# Patient Record
Sex: Female | Born: 1975 | Race: White | Hispanic: Yes | Marital: Single | Smoking: Never smoker
Health system: Southern US, Community
[De-identification: ages and names within clinical notes are randomized; demographics above are authoritative.]

## PROBLEM LIST (undated history)

## (undated) DIAGNOSIS — Z789 Other specified health status: Secondary | ICD-10-CM

## (undated) DIAGNOSIS — O139 Gestational [pregnancy-induced] hypertension without significant proteinuria, unspecified trimester: Secondary | ICD-10-CM

## (undated) HISTORY — DX: Gestational (pregnancy-induced) hypertension without significant proteinuria, unspecified trimester: O13.9

---

## 1999-06-20 ENCOUNTER — Other Ambulatory Visit: Admission: RE | Admit: 1999-06-20 | Discharge: 1999-06-20 | Payer: Self-pay | Admitting: Obstetrics

## 1999-08-14 ENCOUNTER — Inpatient Hospital Stay (HOSPITAL_COMMUNITY): Admission: AD | Admit: 1999-08-14 | Discharge: 1999-08-19 | Payer: Self-pay | Admitting: Obstetrics

## 1999-08-14 ENCOUNTER — Encounter: Payer: Self-pay | Admitting: *Deleted

## 1999-08-14 ENCOUNTER — Encounter (INDEPENDENT_AMBULATORY_CARE_PROVIDER_SITE_OTHER): Payer: Self-pay

## 1999-08-21 ENCOUNTER — Inpatient Hospital Stay (HOSPITAL_COMMUNITY): Admission: EM | Admit: 1999-08-21 | Discharge: 1999-08-21 | Payer: Self-pay | Admitting: *Deleted

## 1999-11-05 ENCOUNTER — Emergency Department (HOSPITAL_COMMUNITY): Admission: EM | Admit: 1999-11-05 | Discharge: 1999-11-05 | Payer: Self-pay | Admitting: Emergency Medicine

## 1999-11-07 ENCOUNTER — Emergency Department (HOSPITAL_COMMUNITY): Admission: EM | Admit: 1999-11-07 | Discharge: 1999-11-07 | Payer: Self-pay | Admitting: Emergency Medicine

## 2006-03-12 ENCOUNTER — Ambulatory Visit (HOSPITAL_COMMUNITY): Admission: RE | Admit: 2006-03-12 | Discharge: 2006-03-12 | Payer: Self-pay | Admitting: Family Medicine

## 2006-03-17 ENCOUNTER — Ambulatory Visit: Payer: Self-pay | Admitting: Obstetrics & Gynecology

## 2006-03-31 ENCOUNTER — Ambulatory Visit: Payer: Self-pay | Admitting: Obstetrics & Gynecology

## 2006-04-01 ENCOUNTER — Ambulatory Visit: Payer: Self-pay | Admitting: Obstetrics & Gynecology

## 2006-04-17 ENCOUNTER — Ambulatory Visit: Payer: Self-pay | Admitting: Obstetrics & Gynecology

## 2006-05-08 ENCOUNTER — Ambulatory Visit: Payer: Self-pay | Admitting: Family Medicine

## 2006-05-12 ENCOUNTER — Ambulatory Visit (HOSPITAL_COMMUNITY): Admission: RE | Admit: 2006-05-12 | Discharge: 2006-05-12 | Payer: Self-pay | Admitting: Family Medicine

## 2006-05-22 ENCOUNTER — Ambulatory Visit: Payer: Self-pay | Admitting: Obstetrics & Gynecology

## 2006-05-29 ENCOUNTER — Ambulatory Visit: Payer: Self-pay | Admitting: Family Medicine

## 2006-06-05 ENCOUNTER — Ambulatory Visit: Payer: Self-pay | Admitting: *Deleted

## 2006-06-12 ENCOUNTER — Ambulatory Visit: Payer: Self-pay | Admitting: Family Medicine

## 2006-06-19 ENCOUNTER — Ambulatory Visit: Payer: Self-pay | Admitting: Family Medicine

## 2006-06-26 ENCOUNTER — Ambulatory Visit: Payer: Self-pay | Admitting: Obstetrics & Gynecology

## 2006-07-03 ENCOUNTER — Ambulatory Visit: Payer: Self-pay | Admitting: Obstetrics & Gynecology

## 2006-07-07 ENCOUNTER — Ambulatory Visit: Payer: Self-pay | Admitting: Family Medicine

## 2006-07-10 ENCOUNTER — Ambulatory Visit: Payer: Self-pay | Admitting: Family Medicine

## 2006-07-10 ENCOUNTER — Ambulatory Visit (HOSPITAL_COMMUNITY): Admission: RE | Admit: 2006-07-10 | Discharge: 2006-07-10 | Payer: Self-pay | Admitting: Obstetrics and Gynecology

## 2006-07-14 ENCOUNTER — Ambulatory Visit: Payer: Self-pay | Admitting: Obstetrics & Gynecology

## 2006-07-15 ENCOUNTER — Inpatient Hospital Stay (HOSPITAL_COMMUNITY): Admission: AD | Admit: 2006-07-15 | Discharge: 2006-07-18 | Payer: Self-pay | Admitting: Family Medicine

## 2006-07-15 ENCOUNTER — Ambulatory Visit: Payer: Self-pay | Admitting: Physician Assistant

## 2010-06-01 NOTE — Discharge Summary (Signed)
[facility]  Patient:    Anna Higgins, Anna Higgins                    MRN: [id number] Adm. Date:  [id number] Disc. Date: [id number] Attending:  Michaelle Copas Dictator:   Cheree Ditto, M.D. CC:         [facility]   Discharge Summary  DATE OF BIRTH:                02-08-1975  DISCHARGE DIAGNOSES:          1. Severe preeclampsia, improved.                               2. Term pregnancy, delivered.  PROCEDURES:                   1. Ultrasound August 14, 1999.                               2. Low transverse C-section August 15, 1999.  DISCHARGE MEDICATIONS:        1. Procardia XL 60 mg 1 p.o. q.d.                               2. ______ prenatal vitamins 1 p.o. q.d. while                                  breast feeding.                               3. Ibuprofen 600 mg 1 p.o. q.8h. p.r.n. pain.                               4. Depo-Provera given prior to discharge.  DISPOSITION AND FOLLOW-UP:     1. Maternity admissions follow-up for a blood                                  pressure check and evaluation in two days                                  after discharge.                               2[facility]  ADMITTING HISTORY/PHYSICAL:   [age] year-old g 1, p 0, at 36 and three weeks by ultrasound at 28 weeks but 40 weeks by serial L P, presents after being sent over from Dr. Tawny Hopping office with increased blood pressure, edema in her hands, face, and legs, as well as proteinuria.  PAST OB HISTORY:              None.  SOCIAL HISTORY:               No smoking, alcohol, or other drugs.  PAST MEDICAL HISTORY:         No  chronic medical problems.  PAST SURGICAL HISTORY:        None.  MEDICATIONS:                  Prenatal vitamins.  ALLERGIES:                    No known drug allergies.  PRENATAL LABORATORIES:        VDRL negative, hepatitis B surface antigen negative, hemoglobin 11.3, hematocrit  33.9, Rubella immune, one hour glucose 118.  PHYSICAL EXAMINATION:  VITAL SIGNS:                  Temperature 97.7, blood pressure 170s-200s/90s-115, heart rate 50s to 60s, fetal heart tones 130s with some acceleration and then mild variable decelerations.  HEENT:                        Pupils, equal, round, reactive to light and accommodation.  Extraocular movements intact.  Oropharynx is clear.  NECK:                         Supple without lymphadenopathy.  HEART:                        Regular rate and rhythm.  LUNGS:                        Clear to auscultation bilaterally.  ABDOMEN:                      Soft and gravid.  CERVICAL:                     Fingertip, 50% and -3.  Hernia is negative.  LABORATORIES:                 Sodium 134, potassium 4.2, chloride 109, CO2 20, BUN 16, creatinine .9, white count 4.6, hemoglobin 3.2, hematocrit 39.1, platelets 193, uric acid 5.9, LDH 220, SGOT 29, SGPT 21.Marland Kitchen  HOSPITAL COURSE:              Ms. Anna Higgins is a 35 year old g 1, at 63 and 3 by a 28 week ultrasound but approximately 40 weeks by a sure LMP.  She presented with severe preeclampsia.  She was admitted for evaluation.  Ultrasound was obtained on August 14, 1999, which showed a single living intrauterine fetus in cephalic presentation.  There was evidence for asymmetric intrauterine growth restriction.  The baby had a normal biophysical profile.  She was not having any headaches or neurological symptoms and had been feeling baby move well over the past couple of days.  Induction was attempted with Cervidil.  Fetal heart rate decelerations prompted an urgent low transverse C-section.  Apgars were 8 at one minute and 8 at five minutes.  The patient was transferred to the adult intensive care unit after her C-section.  She was given postpartum magnesium.  After the magnesium was discontinued, she continued to have elevated blood pressures and was given Procardia.  This was increased  prior to her discharge to Procardia XL 60 mg p.o. q.d.  A two-week supply of these medicines was provided through the indigent fund of the hospital.  She was discharged on Procardia.  Blood pressures on the day of discharge were approximately 130s-140/90s.  Arrangements were made for a follow-up in two  days at maternity admission.  At that time, the decision can be made as to whether or not to continue Procardia.  If the Procardia needs to be continued beyond the two week period, she will need either another prescription or more help with her medicines.  She was feeling well and left edematous at the time of discharge.  DISCHARGE LABORATORIES:       Hemoglobin 11.3, hematocrit 33.3 on August 16, 1999.DD:  08/19/99 TD:  08/20/99 Job: [id number]

## 2010-06-01 NOTE — Op Note (Signed)
[facility]  Patient:    Anna Higgins, Anna Higgins                    MRN: [id number] Proc. Date: 08/15/99 Adm. Date:  [id number] Attending:  Michaelle Copas                           Operative Report  PREOPERATIVE DIAGNOSES:       Intrauterine pregnancy at approximately 37 to [redacted] weeks gestation with severe intrauterine growth retardation and severe preeclampsia, and repetitive late fetal heart decelerations.  POSTOPERATIVE DIAGNOSES:      Intrauterine pregnancy at approximately 37 to [redacted] weeks gestation with severe intrauterine growth retardation and severe preeclampsia, and repetitive late fetal heart decelerations.  OPERATION:                    Low transverse cesarean.  OPERATOR:                     Conni Elliot, M.D.                               Garlan Fillers. Marina Goodell, M.D.  ANESTHESIA:                   Spinal.  OPERATIVE FINDINGS:           4 pounds 6 ounce female with Apgars of 8 and 8. Cord pH 7.17.  The cord appeared to be hyperspiraled and short and was sent to pathology in its entirety.  DESCRIPTION OF PROCEDURE:     After adequate level of spinal anesthetic, the patient placed supine in the left-tilt position receiving oxygen and was prepped and draped in a sterile fashion.  A low transverse Pfannenstiel incision was made to the skin, subcutaneous tissue, and fascia.  Rectus muscles divided in the midline.  Perineal cavity entered, bladder flap created, low transverse uterine incision was made.  Upon entering the uterine cavity, it was found to have meconium-stained fluid, and babys head was delivered and received suction prior to delivery of the chest.  The remainder of the body was delivered.  The cord was clamped and cut and passed to the neonatologist in attendance.  ______ uterus, bladder flap, perineum ______ fashion.  Estimated blood loss approximately 750 cc.  ______ . DD:  08/15/99 TD:  08/15/99 Job: [id number]

## 2010-10-30 LAB — CBC
HCT: 30.3 — ABNORMAL LOW
HCT: 36.3
Hemoglobin: 12.2
MCHC: 33.4
MCHC: 33.5
MCV: 87
MCV: 87.4
Platelets: 196
Platelets: 236
RBC: 3.46 — ABNORMAL LOW
RBC: 4.18
RDW: 14
WBC: 6.6

## 2010-10-31 LAB — POCT URINALYSIS DIP (DEVICE)
Hgb urine dipstick: NEGATIVE
Ketones, ur: NEGATIVE
Operator id: 120861
Operator id: 148111
Operator id: 159681
Protein, ur: 30 — AB
Protein, ur: NEGATIVE
Protein, ur: NEGATIVE
Specific Gravity, Urine: 1.01
Specific Gravity, Urine: 1.01
Specific Gravity, Urine: 1.025
Urobilinogen, UA: 0.2
Urobilinogen, UA: 0.2
Urobilinogen, UA: 0.2
pH: 6.5
pH: 6.5
pH: 7

## 2010-11-01 LAB — POCT URINALYSIS DIP (DEVICE)
Hgb urine dipstick: NEGATIVE
Hgb urine dipstick: NEGATIVE
Ketones, ur: NEGATIVE
Ketones, ur: NEGATIVE
Operator id: 134861
Protein, ur: NEGATIVE
Protein, ur: NEGATIVE
Specific Gravity, Urine: 1.01
Specific Gravity, Urine: 1.015
Urobilinogen, UA: 0.2
Urobilinogen, UA: 0.2
pH: 6
pH: 7

## 2010-11-29 LAB — CYTOLOGY - PAP: Pap: NEGATIVE

## 2011-01-15 NOTE — L&D Delivery Note (Signed)
Delivery Note At 2:42 PM a viable female was delivered via Vaginal, Spontaneous Delivery (Presentation: Left Occiput Anterior).  APGAR: , ; weight .   Placenta status: , .  Cord: 3 vessels with the following complications: .  Cord pH: not done  Anesthesia: Local  Episiotomy: None Lacerations:  Suture Repair: 2.0 Est. Blood Loss (mL):   Mom to postpartum.  Baby to nursery-stable.  Atley Scarboro A 12/29/2011, 2:53 PM

## 2011-07-23 LAB — OB RESULTS CONSOLE GC/CHLAMYDIA: Chlamydia: NEGATIVE

## 2011-07-23 LAB — OB RESULTS CONSOLE HIV ANTIBODY (ROUTINE TESTING): HIV: NONREACTIVE

## 2011-07-23 LAB — OB RESULTS CONSOLE ABO/RH: RH Type: POSITIVE

## 2011-07-23 LAB — OB RESULTS CONSOLE RPR: RPR: NONREACTIVE

## 2011-08-24 ENCOUNTER — Inpatient Hospital Stay (HOSPITAL_COMMUNITY): Admission: AD | Admit: 2011-08-24 | Payer: Self-pay | Source: Ambulatory Visit | Admitting: Obstetrics

## 2011-12-03 LAB — OB RESULTS CONSOLE GBS: GBS: NEGATIVE

## 2011-12-18 ENCOUNTER — Other Ambulatory Visit: Payer: Self-pay | Admitting: Obstetrics

## 2011-12-28 ENCOUNTER — Encounter (HOSPITAL_COMMUNITY): Payer: Self-pay | Admitting: *Deleted

## 2011-12-28 ENCOUNTER — Inpatient Hospital Stay (HOSPITAL_COMMUNITY)
Admission: AD | Admit: 2011-12-28 | Discharge: 2011-12-28 | Disposition: A | Discharge status: Home / Self Care | Payer: Self-pay | Source: Ambulatory Visit | Attending: Obstetrics | Admitting: Obstetrics

## 2011-12-28 DIAGNOSIS — R109 Unspecified abdominal pain: Secondary | ICD-10-CM | POA: Insufficient documentation

## 2011-12-28 DIAGNOSIS — M549 Dorsalgia, unspecified: Secondary | ICD-10-CM | POA: Insufficient documentation

## 2011-12-28 DIAGNOSIS — O99891 Other specified diseases and conditions complicating pregnancy: Secondary | ICD-10-CM | POA: Insufficient documentation

## 2011-12-28 HISTORY — DX: Other specified health status: Z78.9

## 2011-12-28 NOTE — Progress Notes (Signed)
Dr Gaynell Face notified of patient arrival. ?? c-section per patient. Cervical exam, 2.5, 80-3. Plan to monitor the patient times 1 hour and recheck her cervix.

## 2011-12-28 NOTE — Progress Notes (Signed)
Dr. Gaynell Face notified of cervical exam change 3, 80, -2. With blood show. Orders received to monitor patient another 1-2 hours and re-check.

## 2011-12-28 NOTE — MAU Note (Signed)
Pt c/o back pain and lower abd cramping on and off all day. Reports some vaginal bleeding and good fetal movment.

## 2011-12-28 NOTE — Progress Notes (Signed)
Dr. Gaynell Face notified of cervical exam. Orders received to send patient home.

## 2011-12-29 ENCOUNTER — Inpatient Hospital Stay (HOSPITAL_COMMUNITY)
Admission: AD | Admit: 2011-12-29 | Discharge: 2011-12-31 | DRG: 775 | Disposition: A | Discharge status: Home / Self Care | Payer: Medicaid Other | Source: Ambulatory Visit | Attending: Obstetrics | Admitting: Obstetrics

## 2011-12-29 ENCOUNTER — Encounter (HOSPITAL_COMMUNITY): Payer: Self-pay | Admitting: Obstetrics and Gynecology

## 2011-12-29 DIAGNOSIS — O09529 Supervision of elderly multigravida, unspecified trimester: Principal | ICD-10-CM | POA: Diagnosis present

## 2011-12-29 LAB — CBC
HCT: 40.8 % (ref 36.0–46.0)
Hemoglobin: 13.9 g/dL (ref 12.0–15.0)
MCH: 30.8 pg (ref 26.0–34.0)
MCHC: 34.1 g/dL (ref 30.0–36.0)
RDW: 13.8 % (ref 11.5–15.5)

## 2011-12-29 MED ORDER — IBUPROFEN 600 MG PO TABS
600.0000 mg | ORAL_TABLET | Freq: Four times a day (QID) | ORAL | Status: DC
  Administered 2011-12-29 – 2011-12-31 (×6): 600 mg via ORAL
  Filled 2011-12-29 (×2): qty 1

## 2011-12-29 MED ORDER — LIDOCAINE HCL (PF) 1 % IJ SOLN: 30.0000 mL | INTRAMUSCULAR | Status: DC | PRN

## 2011-12-29 MED ORDER — ONDANSETRON HCL 4 MG PO TABS: 4.0000 mg | ORAL_TABLET | ORAL | Status: DC | PRN

## 2011-12-29 MED ORDER — DIPHENHYDRAMINE HCL 50 MG/ML IJ SOLN: 12.5000 mg | INTRAMUSCULAR | Status: DC | PRN

## 2011-12-29 MED ORDER — OXYTOCIN BOLUS FROM INFUSION: 500.0000 mL | INTRAVENOUS | Status: DC

## 2011-12-29 MED ORDER — TETANUS-DIPHTH-ACELL PERTUSSIS 5-2.5-18.5 LF-MCG/0.5 IM SUSP
0.5000 mL | Freq: Once | INTRAMUSCULAR | Status: AC
  Administered 2011-12-29: 0.5 mL via INTRAMUSCULAR
  Filled 2011-12-29: qty 0.5

## 2011-12-29 MED ORDER — OXYTOCIN 40 UNITS IN LACTATED RINGERS INFUSION - SIMPLE MED
INTRAVENOUS | Status: AC
  Filled 2011-12-29: qty 1000

## 2011-12-29 MED ORDER — ONDANSETRON HCL 4 MG/2ML IJ SOLN: 4.0000 mg | INTRAMUSCULAR | Status: DC | PRN

## 2011-12-29 MED ORDER — DIBUCAINE 1 % RE OINT: 1.0000 "application " | TOPICAL_OINTMENT | RECTAL | Status: DC | PRN

## 2011-12-29 MED ORDER — LANOLIN HYDROUS EX OINT: TOPICAL_OINTMENT | CUTANEOUS | Status: DC | PRN

## 2011-12-29 MED ORDER — IBUPROFEN 600 MG PO TABS
600.0000 mg | ORAL_TABLET | Freq: Four times a day (QID) | ORAL | Status: DC | PRN
  Administered 2011-12-29: 600 mg via ORAL
  Filled 2011-12-29 (×5): qty 1

## 2011-12-29 MED ORDER — EPHEDRINE 5 MG/ML INJ: 10.0000 mg | INTRAVENOUS | Status: DC | PRN

## 2011-12-29 MED ORDER — ZOLPIDEM TARTRATE 5 MG PO TABS: 5.0000 mg | ORAL_TABLET | Freq: Every evening | ORAL | Status: DC | PRN

## 2011-12-29 MED ORDER — FLEET ENEMA 7-19 GM/118ML RE ENEM: 1.0000 | ENEMA | RECTAL | Status: DC | PRN

## 2011-12-29 MED ORDER — OXYCODONE-ACETAMINOPHEN 5-325 MG PO TABS
1.0000 | ORAL_TABLET | ORAL | Status: DC | PRN
  Administered 2011-12-29 – 2011-12-30 (×3): 1 via ORAL
  Filled 2011-12-29 (×3): qty 1

## 2011-12-29 MED ORDER — PHENYLEPHRINE 40 MCG/ML (10ML) SYRINGE FOR IV PUSH (FOR BLOOD PRESSURE SUPPORT): 80.0000 ug | PREFILLED_SYRINGE | INTRAVENOUS | Status: DC | PRN

## 2011-12-29 MED ORDER — CITRIC ACID-SODIUM CITRATE 334-500 MG/5ML PO SOLN: 30.0000 mL | ORAL | Status: DC | PRN

## 2011-12-29 MED ORDER — SIMETHICONE 80 MG PO CHEW: 80.0000 mg | CHEWABLE_TABLET | ORAL | Status: DC | PRN

## 2011-12-29 MED ORDER — ACETAMINOPHEN 325 MG PO TABS: 650.0000 mg | ORAL_TABLET | ORAL | Status: DC | PRN

## 2011-12-29 MED ORDER — LIDOCAINE HCL (PF) 1 % IJ SOLN
INTRAMUSCULAR | Status: AC
  Filled 2011-12-29: qty 30

## 2011-12-29 MED ORDER — BENZOCAINE-MENTHOL 20-0.5 % EX AERO: 1.0000 "application " | INHALATION_SPRAY | CUTANEOUS | Status: DC | PRN

## 2011-12-29 MED ORDER — OXYTOCIN 40 UNITS IN LACTATED RINGERS INFUSION - SIMPLE MED: 62.5000 mL/h | INTRAVENOUS | Status: DC

## 2011-12-29 MED ORDER — WITCH HAZEL-GLYCERIN EX PADS: 1.0000 "application " | MEDICATED_PAD | CUTANEOUS | Status: DC | PRN

## 2011-12-29 MED ORDER — FERROUS SULFATE 325 (65 FE) MG PO TABS
325.0000 mg | ORAL_TABLET | Freq: Two times a day (BID) | ORAL | Status: DC
  Administered 2011-12-30 – 2011-12-31 (×2): 325 mg via ORAL
  Filled 2011-12-29 (×2): qty 1

## 2011-12-29 MED ORDER — FENTANYL 2.5 MCG/ML BUPIVACAINE 1/10 % EPIDURAL INFUSION (WH - ANES): 14.0000 mL/h | INTRAMUSCULAR | Status: DC

## 2011-12-29 MED ORDER — LACTATED RINGERS IV SOLN: INTRAVENOUS | Status: DC

## 2011-12-29 MED ORDER — DIPHENHYDRAMINE HCL 25 MG PO CAPS: 25.0000 mg | ORAL_CAPSULE | Freq: Four times a day (QID) | ORAL | Status: DC | PRN

## 2011-12-29 MED ORDER — BUTORPHANOL TARTRATE 1 MG/ML IJ SOLN: 1.0000 mg | INTRAMUSCULAR | Status: DC | PRN

## 2011-12-29 MED ORDER — LACTATED RINGERS IV SOLN: 500.0000 mL | Freq: Once | INTRAVENOUS | Status: DC

## 2011-12-29 MED ORDER — SENNOSIDES-DOCUSATE SODIUM 8.6-50 MG PO TABS
2.0000 | ORAL_TABLET | Freq: Every day | ORAL | Status: DC
  Administered 2011-12-29: 2 via ORAL

## 2011-12-29 MED ORDER — LACTATED RINGERS IV SOLN: 500.0000 mL | INTRAVENOUS | Status: DC | PRN

## 2011-12-29 MED ORDER — OXYCODONE-ACETAMINOPHEN 5-325 MG PO TABS
1.0000 | ORAL_TABLET | ORAL | Status: DC | PRN
  Administered 2011-12-30: 1 via ORAL
  Filled 2011-12-29: qty 2

## 2011-12-29 MED ORDER — PRENATAL MULTIVITAMIN CH
1.0000 | ORAL_TABLET | Freq: Every day | ORAL | Status: DC
  Administered 2011-12-30 – 2011-12-31 (×2): 1 via ORAL
  Filled 2011-12-29 (×2): qty 1

## 2011-12-29 MED ORDER — ONDANSETRON HCL 4 MG/2ML IJ SOLN: 4.0000 mg | Freq: Four times a day (QID) | INTRAMUSCULAR | Status: DC | PRN

## 2011-12-29 NOTE — MAU Note (Signed)
Pt c/o ctx and vag bleeding. Hurting more than yesterday, Good fetal movement reported.

## 2011-12-29 NOTE — H&P (Signed)
This is Dr. Francoise Ceo dictating the history and physical on  Anna Higgins she's a 36 year old gravida 3 para 01/15/2000 at 38 weeks and 6 days her EDC is 12/27 13 negative GBS she was admitted to labor and delivery fully dilated at a plus one station amniotomy performed fluid clear and she had a normal vaginal delivery of a female Apgar 99 from the LOA position placenta spontaneous intact no episiotomy or laceration Past medical history negative Past surgical history she had one C-section Social history negative Family history negative System review noncontributory Physical exam well-developed female postpartum HEENT negative Lungs clear to P&A Breasts engorged Heart regular rhythm no murmurs no gallops Abdomen 20 week size uterus postpartum Pelvic as described above Extremities negative and

## 2011-12-30 LAB — CBC
HCT: 33.9 % — ABNORMAL LOW (ref 36.0–46.0)
MCH: 30.1 pg (ref 26.0–34.0)
MCV: 91.9 fL (ref 78.0–100.0)
Platelets: 182 10*3/uL (ref 150–400)
RBC: 3.69 MIL/uL — ABNORMAL LOW (ref 3.87–5.11)
WBC: 10.2 10*3/uL (ref 4.0–10.5)

## 2011-12-30 NOTE — Progress Notes (Signed)
Ur chart review completed.  

## 2011-12-30 NOTE — Progress Notes (Signed)
Patient ID: Anna Higgins, female   DOB: October 15, 1975, 36 y.o.   MRN: [id number] Postpartum day one Vital signs normal Fundus firm Moderate Negative Doing well

## 2011-12-31 NOTE — Discharge Summary (Signed)
Obstetric Discharge Summary Reason for Admission: onset of labor Prenatal Procedures: none Intrapartum Procedures: spontaneous vaginal delivery Postpartum Procedures: none Complications-Operative and Postpartum: none Hemoglobin  Date Value Range Status  12/30/2011 11.1* 12.0 - 15.0 g/dL Final     DELTA CHECK NOTED     REPEATED TO VERIFY     HCT  Date Value Range Status  12/30/2011 33.9* 36.0 - 46.0 % Final    Physical Exam:  General: alert Lochia: appropriate Uterine Fundus: firm Incision: healing well DVT Evaluation: No evidence of DVT seen on physical exam.  Discharge Diagnoses: Term Pregnancy-delivered  Discharge Information: Date: 12/31/2011 Activity: pelvic rest Diet: routine Medications: Percocet Condition: stable Instructions: refer to practice specific booklet Discharge to: home Follow-up Information    Call in 6 weeks to follow up.         Newborn Data: Live born female  Birth Weight: 5 lb 14.5 oz (2680 g) APGAR: 8, 9  Home with mother.  MARSHALL,BERNARD A 12/31/2011, 7:19 AM

## 2012-01-06 ENCOUNTER — Inpatient Hospital Stay (HOSPITAL_COMMUNITY): Admission: RE | Admit: 2012-01-06 | Payer: Self-pay | Source: Ambulatory Visit | Admitting: Obstetrics

## 2012-01-06 ENCOUNTER — Encounter (HOSPITAL_COMMUNITY): Admission: RE | Payer: Self-pay | Source: Ambulatory Visit

## 2012-01-06 SURGERY — Surgical Case
Anesthesia: Regional | Laterality: Bilateral

## 2012-12-21 ENCOUNTER — Other Ambulatory Visit (HOSPITAL_COMMUNITY): Payer: Self-pay | Admitting: Nurse Practitioner

## 2012-12-21 DIAGNOSIS — Z3689 Encounter for other specified antenatal screening: Secondary | ICD-10-CM

## 2012-12-21 LAB — OB RESULTS CONSOLE HIV ANTIBODY (ROUTINE TESTING): HIV: NONREACTIVE

## 2012-12-21 LAB — OB RESULTS CONSOLE VARICELLA ZOSTER ANTIBODY, IGG: Varicella: IMMUNE

## 2012-12-21 LAB — OB RESULTS CONSOLE GC/CHLAMYDIA
Chlamydia: NEGATIVE
Gonorrhea: NEGATIVE

## 2012-12-21 LAB — OB RESULTS CONSOLE PLATELET COUNT: PLATELETS: 213 10*3/uL

## 2012-12-21 LAB — OB RESULTS CONSOLE HGB/HCT, BLOOD
HEMATOCRIT: 36 %
HEMOGLOBIN: 12.2 g/dL

## 2012-12-21 LAB — CULTURE, OB URINE: Urine Culture, OB: NEGATIVE

## 2012-12-21 LAB — GLUCOSE TOLERANCE, 1 HOUR: Glucose, 1 Hour GTT: 95

## 2013-01-04 ENCOUNTER — Ambulatory Visit (HOSPITAL_COMMUNITY)
Admission: RE | Admit: 2013-01-04 | Discharge: 2013-01-04 | Disposition: A | Discharge status: Home / Self Care | Payer: Medicaid Other | Source: Ambulatory Visit | Attending: Nurse Practitioner | Admitting: Nurse Practitioner

## 2013-01-04 DIAGNOSIS — O09529 Supervision of elderly multigravida, unspecified trimester: Secondary | ICD-10-CM | POA: Insufficient documentation

## 2013-01-04 DIAGNOSIS — O34219 Maternal care for unspecified type scar from previous cesarean delivery: Secondary | ICD-10-CM | POA: Insufficient documentation

## 2013-01-04 DIAGNOSIS — Z3689 Encounter for other specified antenatal screening: Secondary | ICD-10-CM

## 2013-01-14 NOTE — L&D Delivery Note (Signed)
Delivery Note At 7:19 PM a viable female was delivered via Vaginal, Spontaneous Delivery (Presentation: Left Occiput Anterior).  APGAR: 8, 9; weight TBD.   Placenta status: Intact, Spontaneous.  Cord: 3 vessels with the following complications: .    Anesthesia: None  Episiotomy: None Lacerations: None Suture Repair: na Est. Blood Loss (mL): 400  Mom to postpartum.  Baby to Couplet care / Skin to Skin.  Pt pushed with good maternal effort to deliver a liveborn female via NSVD with spontaneous cry.   Baby placed on maternal abdomen.  Delayed cord clamping performed.  Cord cut by FOB.  Placenta delivered intact with 3V cord via traction and pitocin.  no tears. No complications.  Mom and baby to postpartum.   Anna Higgins L Anna Higgins 05/16/2013, 8:36 PM

## 2013-02-15 ENCOUNTER — Other Ambulatory Visit (HOSPITAL_COMMUNITY): Payer: Self-pay | Admitting: Nurse Practitioner

## 2013-02-15 DIAGNOSIS — O09529 Supervision of elderly multigravida, unspecified trimester: Secondary | ICD-10-CM

## 2013-03-08 ENCOUNTER — Ambulatory Visit (HOSPITAL_COMMUNITY)
Admission: RE | Admit: 2013-03-08 | Discharge: 2013-03-08 | Disposition: A | Discharge status: Home / Self Care | Payer: Medicaid Other | Source: Ambulatory Visit | Attending: Nurse Practitioner | Admitting: Nurse Practitioner

## 2013-03-08 DIAGNOSIS — O09529 Supervision of elderly multigravida, unspecified trimester: Secondary | ICD-10-CM | POA: Insufficient documentation

## 2013-03-08 DIAGNOSIS — O34219 Maternal care for unspecified type scar from previous cesarean delivery: Secondary | ICD-10-CM | POA: Insufficient documentation

## 2013-03-08 LAB — OB RESULTS CONSOLE HGB/HCT, BLOOD
HEMATOCRIT: 36 %
HEMOGLOBIN: 11.6 g/dL

## 2013-03-08 LAB — GLUCOSE TOLERANCE, 1 HOUR: Glucose, 1 Hour GTT: 135

## 2013-03-08 LAB — OB RESULTS CONSOLE RPR: RPR: NONREACTIVE

## 2013-03-15 LAB — GLUCOSE TOLERANCE, 3 HOURS
GLUCOSE 3 HOUR GTT: 93 mg/dL (ref ?–140)
Glucose, Fasting: 87 mg/dL (ref 60–109)
Glucose, GTT - 1 Hour: 206 mg/dL — AB (ref ?–200)
Glucose, GTT - 2 Hour: 166 mg/dL — AB (ref ?–140)

## 2013-03-22 ENCOUNTER — Encounter: Payer: Medicaid Other | Attending: Obstetrics & Gynecology | Admitting: *Deleted

## 2013-03-22 ENCOUNTER — Encounter: Payer: Self-pay | Admitting: Obstetrics and Gynecology

## 2013-03-22 ENCOUNTER — Ambulatory Visit (INDEPENDENT_AMBULATORY_CARE_PROVIDER_SITE_OTHER): Payer: Medicaid Other | Admitting: Obstetrics & Gynecology

## 2013-03-22 DIAGNOSIS — O34219 Maternal care for unspecified type scar from previous cesarean delivery: Secondary | ICD-10-CM

## 2013-03-22 DIAGNOSIS — O99810 Abnormal glucose complicating pregnancy: Secondary | ICD-10-CM

## 2013-03-22 NOTE — Progress Notes (Signed)
Pt was seen on 03/22/13 for GDM diet education.  Pt given written and verbal GDM education.  Reports adequate intake- 3 meals +fruit for snacks.  Agrees to follow GDM diet including 3 meals and 3 snacks with proper carbohydrate/protein combination.  Client has WIC.  Pt will be seen for follow-up as needed.  Melanee LeftErin Cashwell, MPH, RD, LDN

## 2013-03-22 NOTE — Progress Notes (Signed)
  Patient was seen on 03/22/13 for Gestational Diabetes self-management class . The following learning objectives were met by the patient :   States when to check blood glucose levels  Demonstrates proper blood glucose monitoring techniques  States the effect of stress and exercise on blood glucose levels  States the importance of limiting caffeine and abstaining from alcohol and smoking  Plan:  Begin checking BG before breakfast and 2 hours after first bit of breakfast, lunch and dinner after  as directed by MD  Take medication  as directed by MD  Blood glucose monitor given: [employer] Lot # [id number] Exp: 10/29/14 Blood glucose reading: 89  Patient instructed to monitor glucose levels: FBS: 60 - <90 1 hour: <140 2 hour: <120  Patient received the following handouts:  Nutrition Diabetes and Pregnancy  Patient will be seen for follow-up as needed.

## 2013-03-24 ENCOUNTER — Encounter: Payer: Self-pay | Admitting: *Deleted

## 2013-03-29 ENCOUNTER — Encounter: Payer: Self-pay | Admitting: Family Medicine

## 2013-03-29 ENCOUNTER — Encounter: Payer: Self-pay | Admitting: *Deleted

## 2013-03-29 ENCOUNTER — Ambulatory Visit (INDEPENDENT_AMBULATORY_CARE_PROVIDER_SITE_OTHER): Payer: Medicaid Other | Admitting: Family Medicine

## 2013-03-29 VITALS — BP 110/69 | Temp 97.4°F | Wt 172.3 lb

## 2013-03-29 DIAGNOSIS — O0990 Supervision of high risk pregnancy, unspecified, unspecified trimester: Secondary | ICD-10-CM

## 2013-03-29 DIAGNOSIS — O99810 Abnormal glucose complicating pregnancy: Secondary | ICD-10-CM | POA: Insufficient documentation

## 2013-03-29 DIAGNOSIS — O34219 Maternal care for unspecified type scar from previous cesarean delivery: Secondary | ICD-10-CM | POA: Insufficient documentation

## 2013-03-29 LAB — POCT URINALYSIS DIP (DEVICE)
Bilirubin Urine: NEGATIVE
Glucose, UA: NEGATIVE mg/dL
HGB URINE DIPSTICK: NEGATIVE
KETONES UR: NEGATIVE mg/dL
Nitrite: NEGATIVE
PROTEIN: NEGATIVE mg/dL
SPECIFIC GRAVITY, URINE: 1.01 (ref 1.005–1.030)
Urobilinogen, UA: 0.2 mg/dL (ref 0.0–1.0)
pH: 5.5 (ref 5.0–8.0)

## 2013-03-29 MED ORDER — PRENATAL 27-0.8 MG PO TABS: 1.0000 | ORAL_TABLET | Freq: Every day | ORAL | Status: AC

## 2013-03-29 NOTE — Progress Notes (Signed)
P= 76 Pt. Requests PNV refill.  Discussed appropriate weight gain (11-20lb); pt. Verbalized understanding.  New OB packet given.  Pt. [facility]

## 2013-03-29 NOTE — Patient Instructions (Signed)
Diabetes mellitus gestacional  (Gestational Diabetes Mellitus) La diabetes mellitus gestacional, ms comnmente conocida como diabetes gestacional es un tipo de diabetes que desarrollan algunas mujeres durante el Al[place]. En la diabetes gestacional, el pncreas no produce suficiente insulina (una hormona), las clulas son menos sensibles a la insulina que se produce (resistencia a la insulina), o ambos.Normalmente, la Loews Corporation azcares de los alimentos a las clulas de los tejidos. Las clulas de los tejidos [employer] azcares para Dealer. La falta de insulina o la falta de una respuesta normal a la insulina hace que el exceso de azcar se acumule en la sangre en lugar de Location manager en las clulas de los tejidos. Como resultado, se desarrollan los niveles altos de Dispensing optician (hiperglucemia). El efecto de los niveles altos de azcar (glucosa) puede causar muchas complicaciones.  FACTORES DE RIESGO Usted tiene mayor probabilidad de desarrollar diabetes gestacional si tiene antecedentes familiares de diabetes y tambin si tiene uno o ms de los siguientes factores de riesgo:   ndice de masa corporal superior a 30 (obesidad).  Embarazo previo con diabetes gestacional.  Mayor edad en el momento del [place]. Si se mantienen los niveles de Multimedia programmer en un rango normal durante el [place], las mujeres pueden tener un embarazo saludable. Si no controla bien sus niveles de glucosa en sangre, pueden tener tener riesgos usted, su beb antes de nacer (feto), el [place] de parto y [place], o el beb recin nacido.  SNTOMAS  Si se presentan sntomas, stos son similares a los sntomas que normalmente experimentar durante el embarazo. Los sntomas de la diabetes gestacional son:   Lovey Newcomer de la sed (polidipsia).  Aumento de ganas de Garment/textile technologist (poliuria).  Aumento de ganas de orinar durante la noche (nicturia).  Prdida de peso. Prdida de [employer] puede ser muy  rpida.  Infecciones frecuentes y recurrentes.  Cansancio (fatiga).  Debilidad.  Cambios en la visin, como visin borrosa.  Olor a Medical illustrator.  Dolor abdominal. DIAGNSTICO La diabetes se diagnostica cuando hay aumento de los niveles de glucosa en la [place]. El nivel de glucosa en la sangre puede controlarse en uno o ms de los siguientes anlisis de sangre:   Medicin de glucosa en sangre en ayunas. No deber comer durante al menos 8 horas antes de que se tome la [place] de [place].  Anlisis al azar de glucosa en sangre. El nivel de glucosa en sangre se controla en cualquier momento del da sin importar el momento en que haya comido.  Pruebas de glucosa de sangre de hemoglobina A1c. Un anlisis de la hemoglobina A1c proporciona informacin sobre el control de la glucosa en la sangre durante los ltimos 3 meses.  Prueba oral de tolerancia a la glucosa (SOG). La glucosa en la sangre se mide despus de no haber comido (ayuno) durante 1-3 horas y despus de beber una bebida que contiene glucosa. Dado que las hormonas que causan la resistencia a la insulina son ms altas alrededor de las semanas 24-28 de [place], generalmente se realiza un SOG durante ese [place]. Si tiene factores de riesgo para la diabetes gestacional, su mdico puede detectarla antes de las 24 semanas de [place]. TRATAMIENTO   Usted tendr que tomar medicamentos para la diabetes o insulina diariamente para Theatre manager los niveles de glucosa en sangre en el rango deseado.  Usted tendr [employer] coincidir la dosis de insulina con el ejercicio y Marine scientist de alimentos saludables. El [place] del tratamiento es  en el rango deseado.  · Usted tendrá que hacer coincidir la dosis de insulina con el ejercicio y la elección de alimentos saludables.  El objetivo del tratamiento es mantener el nivel de glucosa en sangre en 60-99 mg / dl antes de la comida (preprandial), a la hora de acostarse y durante la noche mientras dure su embarazo. El objetivo del tratamiento es mantener el mayor pico de azúcar en sangre después de la comida (glucosa postprandial) en 100-140 mg/dl.   INSTRUCCIONES  PARA EL CUIDADO EN EL HOGAR   · Haga que su nivel de hemoglobina A1c sea verificado dos veces al año.  · Realice un control diario de glucosa en sangre según las indicaciones de su médico. Es común realizar controles con frecuencia de la glucosa en sangre.  · Supervise las cetonas en la orina cuando esté enferma y según las indicaciones de su médico.  · Tome su medicamento para la diabetes y la insulina según las indicaciones de su médico para mantener el nivel de glucosa en sangre en su rango deseado.  · Nunca se quede sin medicamentos para la diabetes o sin insulina. Es necesario recibirla todos los días.  · Ajuste la insulina sobre la base de la ingesta de hidratos de carbono. Los hidratos de carbono pueden aumentar los niveles de glucosa en la sangre, pero deben incluirse en su dieta. Los hidratos de carbono aportan vitaminas, minerales y fibra que son una parte esencial de una dieta saludable. Los hidratos de carbono se encuentran en frutas, verduras, granos enteros, productos lácteos, legumbres y alimentos que contienen azúcares añadidos.  ·   · Consuma alimentos saludables. Alterne 3 comidas con 3 colaciones.  · Mantenga un aumento de peso saludable. El aumento del peso total varía de acuerdo con el índice de masa corporal antes del embarazo (IMC).  · Lleve una tarjeta de alerta médica o lleve una pulsera de alerta médica.  · Lleve consigo un refrigerio de 15 gramos de hidrato de carbonos en todo momento para controlar los niveles bajos de glucosa en sangre (hipoglucemia). Algunos ejemplos de aperitivos de 15 gramos de hidratos de carbono son:  · Tabletas de glucosa, 3 o 4  ·   Gel de glucosa, tubo de 15 gramos  · Pasas de uva, 2 cucharadas (24 g)  · Caramelos de goma, 6  · Galletas de animales, 8  · Jugo de fruta, soda regular, o leche baja en grasa, 4 onzas (120 ml)  · Pastillas gomitas, 9  ·   · Reconocer la hipoglucemia. Durante el embarazo la hipoglucemia se produce cuando hay niveles de glucosa en  sangre de 60 mg/dl o menos. El riesgo de hipoglucemia aumenta durante el ayuno o saltarse las comidas, durante o después del ejercicio intenso, y durante el sueño. Los síntomas de hipoglucemia son:  · Temblores o sacudidas.  · Disminución de capacidad de concentración.  · Sudoración.  · Aumento en el ritmo cardíaco  · Dolor de cabeza.  · Boca seca.  · Hambre.  · Irritabilidad.  · Ansiedad.  · Sueño agitado.  · Alteración del habla o de la coordinación.  · Confusión.  · Tratar la hipoglucemia rápidamente. Si usted está alerta y puede tragar con seguridad, siga la regla de 15:15:  · Tome de 15 a 20 gramos de glucosa de acción rápida o hidratos de carbono . Las opciones de acción rápida son un gel de glucosa, unas tabletas de glucosa, o 4 onzas (120 ml) de jugo de frutas, soda   niveles de glucosa en la sangre vuelven a la normalidad.  Est alerta a la poliuria y polidipsia, que son los primeros signos de hiperglucemia. El conocimiento temprano de la hiperglucemia permite un tratamiento oportuno. Controle la hiperglucemia segn le indic su mdico.  Haga actividad fsica por lo menos 30 minutos al da o como lo indique su mdico. Se recomienda diez minutos de actividad fsica cronometrados 30 minutos despus de cada comida para controlar los niveles de glucosa en sangre postprandial.  Ajuste su dosis de insulina y la ingesta de alimentos, segn sea necesario, si se inicia un nuevo ejercicio o deporte.  Siga su plan diario de enfermo en algn momento que no puede comer o beber como de costumbre.  Evite el tabaco y el alcohol.  Concurra regularmente a las visitas de control con el mdico.  Siga el consejo  de su mdico respecto a los controles prenatales y posteriores al parto (post-parto), a la planificacin de las comidas, al ejercicio, a los medicamentos, a las vitaminas, a los anlisis de sangre, a otras pruebas mdicas y fsicas.  Cuide diariamente la piel y los pies. Examine su piel y los pies diariamente para detectar cortes, moretones, enrojecimiento, problemas en las uas, sangrado, ampollas o llagas.  Cepllese los dientes y encas por lo menos dos veces al da y use hilo dental al menos una vez por da. Concurra regularmente a las visitas de control con el dentista.  Programe un examen de vista durante el primer trimestre de su embarazo o como lo indique su mdico.  Comparta su plan de control de diabetes en su trabajo o en la escuela.  Mantngase al da con las vacunas.  Aprenda a manejar el estrs.  Obtenga educacin continuada y ayuda para la diabetes cuando sea necesario. SOLICITE ATENCIN MDICA SI:   No puede comer alimentos o beber por ms de 6 horas.  Tiene nuseas o ha vomitado durante ms de 6 horas.  Tiene un nivel de glucosa en sangre de 200 mg/dl y cetonas en la orina.  Presenta algn cambio en el estado mental.  Tiene problemas de visin.  Sufre un dolor persistente de cabeza.  Tiene dolor o molestias en el abdomen.  Desarrolla una enfermedad grave adicional.  Tiene diarrea durante ms de 6 horas.  Ha estado enferma o ha tenido fiebre durante un par de das y no mejora. SOLICITE ATENCIN MDICA DE INMEDIATO SI:   Tiene dificultad para respirar.  Ya no siente los movimientos del beb.  Est sangrando o tiene flujo vaginal.  Comienza a tener contracciones o trabajo de parto prematuro. ASEGRESE DE QUE:  Comprende estas instrucciones.  Controlar su enfermedad.  Solicitar ayuda de inmediato si no mejora o si empeora. Document Released: 10/10/2004 Document Revised: 09/25/2011 ExitCare Patient Information 2014 ExitCare, LLC.  Lactancia  materna (Breastfeeding) Decidir amamantar es una de las mejores elecciones que puede hacer por usted y su beb. El cambio hormonal durante el embarazo produce el desarrollo del tejido mamario y aumenta la cantidad y el tamao de los conductos galactforos. Estas hormonas tambin permiten que las protenas, los azcares y las grasas de la sangre produzcan la leche materna en las glndulas productoras de leche. Las hormonas impiden que la leche materna sea liberada antes del nacimiento del beb, adems de impulsar el flujo de leche luego del nacimiento. Una vez que ha comenzado a amamantar, pensar en el beb, as como la succin o el llanto, pueden estimular la liberacin de leche de las glndulas productoras de   leche.  LOS BENEFICIOS DE Verdell Carmine Para el beb  La primera leche (calostro) ayuda al mejor funcionamiento del sistema digestivo del beb.  La leche tiene anticuerpos que ayudan a Chemical engineer las infecciones en el beb.  El beb tiene una menor incidencia de asma, alergias y del sndrome de muerte sbita del lactante.  Los nutrientes en la [place] materna son mejores para el beb que la [place] maternizada y estn preparados exclusivamente para cubrir las necesidades del beb.  La leche materna mejora el desarrollo cerebral del beb.  Es menos probable que el beb desarrolle otras enfermedades, como obesidad infantil, asma o diabetes mellitus de tipo 2. Para usted   La lactancia materna favorece el desarrollo de un vnculo muy especial entre la madre y el beb.  Es conveniente. Siempre est disponible a la temperatura correcta y es [place].  La lactancia materna ayuda a quemar caloras y a perder el peso ganado durante el [place].  Favorece la contraccin del tero al tamao que tena antes del embarazo de manera ms rpida y disminuye el sangrado (loquios) despus del parto.  La lactancia materna contribuye a reducir Catering manager de desarrollar diabetes mellitus de tipo 2, osteoporosis o  cncer de mama o de ovario en el futuro. SIGNOS DE QUE EL BEB EST HAMBRIENTO Primeros signos de hambre  Aumenta su estado de [place].  Se estira.  Mueve la cabeza de un lado a otro.  Mueve la cabeza y abre la boca cuando se le toca la mejilla o la comisura de la boca (reflejo de bsqueda).  [place] vocalizaciones, tales como sonidos de succin, se relame los labios, emite arrullos, suspiros, o chirridos.  Mueve la [employer] boca.  Se chupa con ganas los dedos o las manos. Signos tardos de [employer].  Llora de manera intermitente. Signos de [employer]oles[employer] signos de hambre extrema requerirn que lo calme y lo consuele antes de que el beb pueda alimentarse adecuadamente. No espere a que se manifiesten los siguientes signos de hambre extrema para comenzar a Economist:   Air cabin crew.  Llanto intenso y fuerte.   Gritos. INFORMACIN BSICA SOBRE LA LACTANCIA MATERNA Iniciacin de la lactancia materna  Encuentre un lugar cmodo para sentarse o acostarse, con un buen respaldo para el cuello y la espalda.  Coloque una almohada o una manta enrollada debajo del beb para acomodarlo a la altura de la mama (si est sentada). Las almohadas para Economist se han diseado especialmente a fin de servir de apoyo para los brazos y el beb Kellogg.  Asegrese de que el abdomen del beb est frente al suyo.  Masajee suavemente la mama. Con las yemas de los dedos, masajee la pared del pecho hacia el pezn en un movimiento circular. Esto estimula el flujo de [place]. Es posible que Oceanographer este movimiento mientras amamanta si la leche fluye lentamente.  Sostenga la mama con el pulgar por arriba del pezn y los otros 4 dedos por debajo de la mama. Asegrese de que los dedos se encuentren lejos del pezn y de la boca del beb.  Empuje suavemente los labios del beb con el pezn o con el dedo.  Cuando la boca del beb se abra lo suficiente,  acrquelo rpidamente a la mama e introduzca todo el pezn y la zona oscura que lo rodea (areola), tanto como sea posible, dentro de la boca del beb.  Debe haber ms areola visible por arriba del labio superior del beb que  por debajo del labio inferior.  La lengua del beb debe estar entre la enca inferior y la Freelandmama.  Asegrese de que la boca del beb est en la posicin correcta alrededor del pezn (prendida). Los labios del beb deben crear un sello sobre la mama, doblndose hacia afuera (invertidos).  Es comn que el beb succione durante 2 a 3 minutos para que comience el flujo de Fountain Hillsleche materna. Cmo debe prenderse Es muy importante que le ensee al beb cmo prenderse adecuadamente a la mama. Si el beb no se prende adecuadamente, puede causarle dolor en el pezn y reducir la produccin de Nivervilleleche materna, y hacer que el beb tenga un escaso aumento de [place]villepeso. Adems, si el beb no se prende adecuadamente al pezn, puede tragar aire durante la alimentacin. Esto puede causarle molestias al beb. Hacer eructar al beb al Pilar Platecambiar de mama puede ayudarlo a liberar el aire. Sin embargo, ensearle al beb cmo prenderse a la mama adecuadamente es la mejor manera de evitar que se sienta molesto por tragar Oceanographeraire mientras se alimenta. Signos de que el beb se ha prendido adecuadamente al pezn:   Payton Doughtyironea o succiona de modo silencioso, sin causarle dolor.  Se escucha que traga cada 3 o 4 succiones.   Hay movimientos musculares por arriba y por delante de sus odos al Printmakersuccionar. Signos de que el beb no se ha prendido Audiological scientistadecuadamente al pezn:   Hace ruidos de succin o de chasquido mientras se alimenta.  Dolor en el pezn. Si cree que el beb no se prendi correctamente, deslice el dedo en la comisura de la boca y Ameren Corporationcolquelo entre las encas del beb para interrumpir la succin. Intente comenzar a amamantar nuevamente. Signos de Fish farm managerlactancia materna exitosa Signos del beb:   Disminucin gradual en el  nmero de succiones o cese completo de la succin.  Se duerme.  Relaja el cuerpo.  Retiene una pequea cantidad de Iolaleche en su boca.  Se desprende solo del pecho. Signos que presenta usted:  Las mamas han aumentado la firmeza, el peso y el tamao 1 a 3 horas despus de Museum/gallery exhibitions officeramamantar.  Estn ms blandas inmediatamente despus de amamantar.  Un aumento del volumen de Fairfieldleche, y tambin el cambio de su consistencia y color se producen hacia el quinto da de Tour managerlactancia materna.  Los pezones no duelen, ni estn agrietados ni sangran. Signos de que su beb recibe la cantidad de leche suficiente  Moja al menos 3 paales en 24 horas. La orina debe ser clara y de color amarillo plido a los 5 [address]  [address] de [place]ida.  Defeca al menos 3 veces en 24 horas a los [address]  [address] de [address]. La materia fecal debe ser blanda y Hartletonamarillenta.  Defeca al menos 3 veces en 24 horas a los [address] das de [address]. La materia fecal debe ser grumosa y Poundamarillenta.  No registra una prdida de peso mayor del 10% del peso al nacer durante los primeros 3 [address]  [address] de [place]ida.  Aumenta de peso un promedio de 4 a 7onzas (120 a 210ml) por semana despus de los [address]  [address] de vida.  Aumenta de West Portsmouthpeso, Westgatediariamente, de Cromwellmanera consistente a Glass blower/designerpartir de los 5 [address]  [address] de vida, sin Passenger transport managerregistrar prdida de peso despus de las 2 semanas de vida. Despus de alimentarse, es posible que el beb regurgite una pequea cantidad. Esto es frecuente. FRECUENCIA Y DURACIN DE LA LACTANCIA MATERNA El amamantamiento frecuente la ayudar a producir ms Azerbaijanleche y a Education officer, communityprevenir problemas de Engineer, miningdolor en los pezones e hinchazn  en las mamas. Alimente al beb cuando muestre signos de hambre o si siente la necesidad de reducir la congestin de las [place]. Esto se denomina "lactancia a demanda". Evite el uso del chupete mientras trabaja para establecer la lactancia (las primeras 4 a 6 semanas despus del nacimiento del beb). Despus de este perodo, podr ofrecerle un chupete. Las investigaciones demostraron  que el uso del chupete durante el primer ao de vida del beb disminuye el riesgo de desarrollar el sndrome de muerte sbita del lactante (SMSL). Permita que el nio se alimente en cada mama todo lo que desee. Contine amamantando al beb hasta que haya terminado de alimentarse. Cuando el beb se desprende o se queda dormido mientras se est alimentando de la primera mama, ofrzcale la segunda. Debido a que, con frecuencia, los recin [place] las primeras semanas de vida, es posible que deba despertar a su beb para alimentarlo. Los horarios de Acupuncturist de un beb a otro. Sin embargo, las siguientes reglas pueden servir como gua para ayudarle a Lawyer que el beb se alimenta adecuadamente:  Se puede amamantar a los recin nacidos (bebs de 4 semanas o menos de vida) cada 1 a 3 horas.  No deben transcurrir ms de 3 horas durante el da o 5 horas durante la noche sin que se amamante a los recin nacidos.  Debe amamantar al beb 8 veces como mnimo, en un perodo de 24 horas, hasta que comience a introducir slidos en su dieta, a los 6 meses de vida aproximadamente. EXTRACCIN DE [employer] MATERNA La extraccin y Contractor de la leche materna le permiten asegurarse de que el beb se alimente exclusivamente de [place], aun en momentos en los que no puede amamantar. Esto tiene especial importancia si debe regresar al Aleen Campi en el perodo en que an est amamantando o si no puede estar presente en los momentos en que el beb debe alimentarse. Su asesor en lactancia puede orientarla sobre cunto tiempo es seguro almacenar [place].  El sacaleche es un aparato que le permite extraer leche de la mama a un recipiente estril. Luego, la leche materna extrada puede almacenarse en un refrigerador o freezer. Algunos sacaleches son Birdie Riddle, Delaney Meigs otros son elctricos. Consulte a su asesor en lactancia qu tipo ser ms conveniente para usted. Los sacaleches  se pueden comprar, sin embargo, algunos hospitales y grupos de apoyo a la lactancia materna alquilan Sports coach. Un asesor en lactancia puede ensearle cmo extraer [employer]ley, en caso de que prefiera no usar un sacaleche.  CMO CUIDAR LAS MAMAS DURANTE LA LACTANCIA MATERNA Los pezones se secan, agrietan y duelen durante la Tour manager. Las siguientes recomendaciones pueden ayudarle a Pharmacologist las [employer]PP[employer] Partners y sanas:  Careers information officer usar jabn en los pezones.  Use un sostn de soporte. Aunque no son esenciales, las camisetas sin mangas o los sostenes especiales para Museum/gallery exhibitions officer estn diseados para acceder fcilmente a las mamas, para Museum/gallery exhibitions officer sin tener que quitarse todo el sostn o la camiseta. Evite usar sostenes con aro o sostenes muy ajustados.  Seque al aire sus pezones durante 3 a despus de amamantar al beb.  Utilice solo apsitos de Haematologist sostn para Environmental health practitioner las prdidas de [place]. La prdida de un poco de [employer] Enterprise Group tomas es normal.  Utilice lanolina sobre los pezones luego de Museum/gallery exhibitions officer. La lanolina ayuda a mantener la humedad normal de la piel. Si [place] lanolina pura, no tiene que lavarse los pezones antes de  alimentar al beb. La lanolina pura no es txica para el beb. Adems, puede extraer Cisco algunas gotas de [place] materna y Community education officer suavemente esa [employer], para que la [place] se seque al aire. Durante las primeras semanas despus de dar a luz, algunas mujeres pueden experimentar hinchazn en las mamas (congestin [place]). La congestin puede hacer que sienta las mamas pesadas, calientes y sensibles al tacto. El pico de la congestin ocurre dentro de los 3 a 5 das despus del [place]. Las siguientes recomendaciones pueden ayudarle a Public house manager la congestin:  Vace por completo las mamas al [employer] American o Printmaker. Puede aplicar calor hmedo en las mamas (en la ducha o con toallas hmedas para manos) antes  de Economist o extraer [employer]ities. Esto aumenta la circulacin y [place] a que la [place]. Si el beb no vaca por completo las mamas cuando lo amamanta, extraiga la [place] restante despus de que haya finalizado.  Use un sostn ajustado (para amamantar o comn) o camiseta sin mangas durante 1 o 2 das para indicar al cuerpo que disminuya ligeramente la produccin de [place].  Aplique compresas de hielo [employer], a menos que le resulte demasiado incmodo.  Asegrese de que el beb se encuentre en la posicin correcta mientras lo alimenta. Si la congestin persiste luego de 48 horas o despus de seguir estas recomendaciones, comunquese con su mdico o un Lobbyist. RECOMENDACIONES GENERALES PARA EL CUIDADO DE LA SALUD DURANTE LA LACTANCIA MATERNA  Consuma alimentos saludables. Alterne comidas y colaciones, comiendo 3 de cada Glass blower/designer. Dado que lo que come So[employer]ron Corporation, es posible que algunas comidas hagan que su beb se vuelva ms irritable de lo habitual. Evite comer este tipo de alimentos, si percibe que afectan de manera negativa al beb.  Beba leche, jugos de fruta y agua para Engineer, water su sed (aproximadamente [place]).  Descanse con frecuencia, reljese y tome sus vitaminas prenatales para evitar la fatiga, el estrs y la anemia.  Contine con los autocontroles de la mama.  Evite masticar y fumar tabaco.  Evite el consumo de alcohol y drogas. Algunos medicamentos, que pueden ser perjudiciales para el beb, pueden pasar a travs de la [employer] Corporation. Es importante que consulte a su mdico antes de Medical sales representative, incluidos todos los medicamentos recetados y de [place], as como los suplementos vitamnicos y herbales. Puede quedar embarazada durante la lactancia. Si desea controlar la natalidad, consulte a su mdico cules son las opciones ms seguras para el beb. SOLICITE ATENCIN MDICA SI:   Usted siente que quiere dejar de Economist o  se siente frustrada con la lactancia.  Siente dolor en las mamas o en los pezones.  Sus pezones estn agrietados o Control and instrumentation engineer.  Sus pechos estn irritados, sensibles o calientes.  Tiene un rea hinchada en cualquiera de las mamas.  Siente escalofros o fiebre.  Tiene nuseas o vmitos.  Presenta una secrecin de otro lquido distinto de la leche materna de los pezones.  Sus mamas no se llenan antes de Economist al beb para el 5. da despus del parto.  Se siente triste y deprimida.  El beb est demasiado somnoliento como para comer bien.  El beb tiene problemas para dormir.  Moja menos de 3 paales en 24 horas.  Defeca menos de 3 veces en 24 horas.  La piel del beb o la parte blanca de sus ojos est amarilla.  El beb no ha aumentado de [place] a  los 5 das de vida. SOLICITE ATENCIN MDICA DE INMEDIATO SI:   El beb est muy cansado (aletargado) y no se despierta para comer.  Le sube la fiebre sin causa. Document Released: 12/31/2004 Document Revised: 04/27/2012 [facility] Patient Information 2014 [place], [place].

## 2013-03-29 NOTE — Progress Notes (Signed)
FBS-87 2 hour pp 85-138 (4 of 21 out of range) Add exercise

## 2013-03-31 ENCOUNTER — Encounter: Payer: Self-pay | Admitting: *Deleted

## 2013-04-12 ENCOUNTER — Ambulatory Visit (INDEPENDENT_AMBULATORY_CARE_PROVIDER_SITE_OTHER): Payer: Self-pay | Admitting: Obstetrics & Gynecology

## 2013-04-12 VITALS — BP 102/68 | Temp 98.4°F | Wt 169.5 lb

## 2013-04-12 DIAGNOSIS — Z603 Acculturation difficulty: Secondary | ICD-10-CM

## 2013-04-12 DIAGNOSIS — O99810 Abnormal glucose complicating pregnancy: Secondary | ICD-10-CM

## 2013-04-12 DIAGNOSIS — O34219 Maternal care for unspecified type scar from previous cesarean delivery: Secondary | ICD-10-CM

## 2013-04-12 DIAGNOSIS — Z609 Problem related to social environment, unspecified: Secondary | ICD-10-CM

## 2013-04-12 DIAGNOSIS — O0990 Supervision of high risk pregnancy, unspecified, unspecified trimester: Secondary | ICD-10-CM

## 2013-04-12 DIAGNOSIS — Z789 Other specified health status: Secondary | ICD-10-CM | POA: Insufficient documentation

## 2013-04-12 LAB — POCT URINALYSIS DIP (DEVICE)
BILIRUBIN URINE: NEGATIVE
GLUCOSE, UA: NEGATIVE mg/dL
HGB URINE DIPSTICK: NEGATIVE
Ketones, ur: NEGATIVE mg/dL
Nitrite: NEGATIVE
Protein, ur: NEGATIVE mg/dL
SPECIFIC GRAVITY, URINE: 1.02 (ref 1.005–1.030)
UROBILINOGEN UA: 0.2 mg/dL (ref 0.0–1.0)
pH: 7 (ref 5.0–8.0)

## 2013-04-12 NOTE — Patient Instructions (Signed)
Regrese a la clinica cuando tenga su cita. Si tiene problemas o preguntas, llama a la clinica o vaya a la sala [facility] emergencia al [facility].    

## 2013-04-12 NOTE — Progress Notes (Signed)
P - 69 

## 2013-04-12 NOTE — Progress Notes (Signed)
Patient is Spanish-speaking only, Spanish interpreter present for this encounter. Patient only has postprandials recorded, no fasting values.  She says she was told to only check 3x/day.  Told her it was a miscommunication; to check fasting and 2 hr PP.  Postprandials are all within range except for one value of 132. Continue diet for now. No other complaints or concerns.  Fetal movement and labor precautions reviewed.  Pelvic cultures next visit.

## 2013-04-26 ENCOUNTER — Ambulatory Visit (INDEPENDENT_AMBULATORY_CARE_PROVIDER_SITE_OTHER): Payer: Self-pay | Admitting: Family Medicine

## 2013-04-26 VITALS — BP 103/60 | Temp 97.7°F | Wt 169.9 lb

## 2013-04-26 DIAGNOSIS — O34219 Maternal care for unspecified type scar from previous cesarean delivery: Secondary | ICD-10-CM

## 2013-04-26 DIAGNOSIS — O0990 Supervision of high risk pregnancy, unspecified, unspecified trimester: Secondary | ICD-10-CM

## 2013-04-26 DIAGNOSIS — O99810 Abnormal glucose complicating pregnancy: Secondary | ICD-10-CM

## 2013-04-26 LAB — POCT URINALYSIS DIP (DEVICE)
BILIRUBIN URINE: NEGATIVE
Glucose, UA: NEGATIVE mg/dL
Hgb urine dipstick: NEGATIVE
KETONES UR: NEGATIVE mg/dL
Nitrite: NEGATIVE
Protein, ur: NEGATIVE mg/dL
Specific Gravity, Urine: 1.015 (ref 1.005–1.030)
Urobilinogen, UA: 0.2 mg/dL (ref 0.0–1.0)
pH: 6.5 (ref 5.0–8.0)

## 2013-04-26 LAB — OB RESULTS CONSOLE GC/CHLAMYDIA
Chlamydia: NEGATIVE
Gonorrhea: NEGATIVE

## 2013-04-26 LAB — OB RESULTS CONSOLE GBS: STREP GROUP B AG: NEGATIVE

## 2013-04-26 NOTE — Progress Notes (Signed)
P-65 

## 2013-04-26 NOTE — Progress Notes (Signed)
S: 38 yo [id number] @ 9377w1d here for ROBV DM- fasting 77-90 2 hr pp- 81-120 (135 once) No ctx, lof, vb. +FM  O: see flowsheet  A/P -US at 38 weeks scheduled -VBAC consent signed and reviewed - cultures done today - cont diet control for DM.   -FMLA paperwork given to [employer] Drug StoresKelly Rassette to be filled out

## 2013-04-26 NOTE — Patient Instructions (Signed)
Tercer trimestre del embarazo  (Third Trimester of Pregnancy) El tercer trimestre del embarazo abarca desde la semana 29 hasta la semana 42, desde el 7 mes hasta el 9. En este trimestre el feto se desarrolla muy rpidamente. Hacia el final del noveno mes, el beb que an no ha nacido mide alrededor de 20 pulgadas (45 cm) de largo y pesa entre 6 y 10 libras (2,700 y 4,500 kg).  CAMBIOS CORPORALES  Su organismo atravesar numerosos cambios durante el embarazo. Los cambios varan de una mujer a otra.   Seguir aumentando de peso. Es esperable que aumente entre 25 y 35 libras (11 16 kg) hacia el final del embarazo.  Podrn aparecer las primeras estras en las caderas, abdomen y mamas.  Tendr necesidad de orinar con ms frecuencia porque el feto baja hacia la pelvis y presiona en la vejiga.  Como consecuencia del embarazo, podr sentir acidez estomacal continuamente.  Podr estar constipada ya que ciertas hormonas hacen que los msculos que hacen progresar los desechos a travs de los intestinos trabajen ms lentamente.  Pueden aparecer hemorroides o abultarse e hincharse las venas (venas varicosas).  Podr sentir dolor plvico debido al aumento de peso ya que las hormonas del embarazo relajan las articulaciones entre los huesos de la pelvis. El dolor de espalda puede ser consecuencia de la exigencia de los msculos que soportan la postura.  Sus mamas seguirn desarrollndose y estarn ms sensibles. A veces sale una secrecin amarilla de las mamas, que se llama calostro.  El ombligo puede salir hacia afuera.  Podr sentir que le falta el aire debido a que se expande el tero.  Podr notar que el feto "baja" o que se siente ms bajo en el abdomen.  Podr tener una prdida de secrecin mucosa con sangre. Esto suele ocurrir entre unos pocos das y una semana antes del parto.  El cuello se vuelve delgado y blando (se borra) cerca de la fecha de parto. QU DEBE ESPERAR EN LAS CONSULTAS  PRENATALES  Le harn exmenes prenatales cada 2 semanas hasta la semana 36. A partir de ese momento le harn exmenes semanales. Durante una visita prenatal de rutina:   La pesarn para verificar que usted y el feto se encuentran dentro de los lmites normales.  Le tomarn la presin arterial.  Le medirn el abdomen para verificar el desarrollo del beb.  Escucharn los latidos fetales.  Se evaluarn los resultados de los estudios solicitados en visitas anteriores.  Le controlarn el cuello del tero cuando est prxima la fecha de parto para ver si se ha borrado. Alrededor de la semana 36 el mdico controlar el cuello del tero. Al mismo tiempo realizar un anlisis de las secreciones del tejido vaginal. Este examen es para determinar si hay un tipo de bacteria, estreptococo Grupo B. El mdico le explicar esto con ms detalle.  El mdico podr preguntarle:   Como le gustara que fuera el parto.  Cmo se siente.  Si siente los movimientos del beb.  Si tiene sntomas anormales, como prdida de lquido, sangrado, dolores de cabeza intenso o clicos abdominales.  Si tiene alguna duda. Otros estudios que podrn realizarse durante el tercer trimestre son:   Anlisis de sangre para controlar sus niveles de hierro (anemia).  Controles fetales para determinar su salud, el nivel de actividad y su desarrollo. Si tiene alguna enfermedad o si tuvo problemas durante el embarazo, le harn estudios. FALSO TRABAJO DE PARTO  Es posible que sienta contracciones pequeas e irregulares que finalmente   desaparecen. Se llaman contracciones de Braxton Hicks o falso trabajo de parto. Las contracciones pueden durar horas, das o an semanas antes de que el verdadero trabajo de parto se inicie. Si las contracciones tienen intervalos regulares, se intensifican o se hacen dolorosas, lo mejor es que la revise su mdico.  SIGNOS DE TRABAJO DE PARTO   Espasmos del tipo menstrual.  Contracciones cada 5  minutos o menos.  Contracciones que comienzan en la parte superior del tero y se expanden hacia abajo, a la zona inferior del abdomen y la espalda.  Sensacin de presin que aumenta en la pelvis o dolor en la espalda.  Aparece una secrecin acuosa o sanguinolenta por la vagina. Si tiene alguno de estos signos antes de la semana 37 del embarazo, llame a su mdico inmediatamente. Debe concurrir al hospital para ser controlada inmediatamente.  INSTRUCCIONES PARA EL CUIDADO EN EL HOGAR   Evite fumar, consumir hierbas, beber alcohol y utilizar frmacos que no le hayan recetado. Estas sustancias qumicas afectan la formacin y el desarrollo del beb.  Siga las indicaciones del profesional con respecto a como tomar los medicamentos. Durante el embarazo, hay medicamentos que son seguros y otros no lo son.  Realice actividad fsica slo segn las indicaciones del mdico. Sentir clicos uterinos es el mejor signo para detener la actividad fsica.  Contine haciendo comidas regulares y sanas.  Use un sostn que le brinde buen soporte si sus mamas estn sensibles.  No utilice la baera con agua caliente, baos turcos o saunas.  Colquese el cinturn de seguridad cuando conduzca.  Evite comer carne cruda queso sin cocinar y el contacto con los utensilios y desperdicios de los gatos. Estos elementos contienen grmenes que pueden causar defectos de nacimiento en el beb.  Tome las vitaminas indicadas para la etapa prenatal.  Pruebe un laxante (si el mdico la autoriza) si tiene constipacin. Consuma ms alimentos ricos en fibra, como vegetales y frutas frescos y cereales enteros. Beba gran cantidad de lquido para mantener la orina de tono claro o amarillo plido.  Tome baos de agua tibia para calmar el dolor o las molestias causadas por las hemorroides. Use una crema para las hemorroides si el mdico la autoriza.  Si tiene venas varicosas, use medias de soporte. Eleve los pies durante 15 minutos,  3 4 veces por da. Limite el consumo de sal en su dieta.  Evite levantar objetos pesados, use zapatos de tacones bajos y mantenga una buena postura.  Descanse con las piernas elevadas si tiene calambres o dolor de cintura.  Visite a su dentista si no lo ha hecho durante el embarazo. Use un cepillo de dientes blando para higienizarse los dientes y use suavemente el hilo dental.  Puede continuar su vida sexual excepto que el mdico le indique otra cosa.  No haga viajes largos excepto que sea absolutamente necesario y slo con la aprobacin de su mdico.  Tome clases prenatales para entender, practicar y hacer preguntas sobre el trabajo de parto y el alumbramiento.  Haga un ensayo sobre la partida al hospital.  Prepare el bolso que llevar al hospital.  Prepare la habitacin del beb.  Contine concurriendo a todas las visitas prenatales segn las indicaciones de su mdico. SOLICITE ATENCIN MDICA SI:   No est segura si est en trabajo de parto o ha roto la bolsa de aguas.  Tiene mareos.  Siente clicos leves, presin en la pelvis o dolor persistente en el abdomen.  Tiene nuseas o vmitos persistentes.  Observa una   secrecin vaginal con mal olor.  Siente dolor al orinar. SOLICITE ATENCIN MDICA DE INMEDIATO SI:   Tiene fiebre.  Pierde lquido o sangre por la vagina.  Tiene sangrado o pequeas prdidas vaginales.  Siente dolor intenso o clicos en el abdomen.  Sube o baja de peso rpidamente.  Le falta el aire y le duele el pecho al respirar.  Sbitamente se le hincha el rostro, las manos, los tobillos, los pies o las piernas de manera extrema.  No ha sentido los movimientos del beb durante una hora.  Siente un dolor de cabeza intenso que no se alivia con medicamentos.  Su visin se modifica. Document Released: 10/10/2004 Document Revised: 09/02/2012 [employer] Patient Information 2014 [employer].  

## 2013-04-27 LAB — GC/CHLAMYDIA PROBE AMP
CT Probe RNA: NEGATIVE
GC PROBE AMP APTIMA: NEGATIVE

## 2013-04-28 ENCOUNTER — Encounter: Payer: Self-pay | Admitting: Family Medicine

## 2013-04-28 LAB — CULTURE, BETA STREP (GROUP B ONLY)

## 2013-05-03 ENCOUNTER — Ambulatory Visit (HOSPITAL_COMMUNITY)
Admission: RE | Admit: 2013-05-03 | Discharge: 2013-05-03 | Disposition: A | Discharge status: Home / Self Care | Payer: Self-pay | Source: Ambulatory Visit | Attending: Nurse Practitioner | Admitting: Nurse Practitioner

## 2013-05-03 ENCOUNTER — Encounter: Payer: Self-pay | Admitting: Obstetrics & Gynecology

## 2013-05-03 ENCOUNTER — Ambulatory Visit (INDEPENDENT_AMBULATORY_CARE_PROVIDER_SITE_OTHER): Payer: Medicaid Other | Admitting: Obstetrics & Gynecology

## 2013-05-03 VITALS — BP 108/68 | HR 72 | Temp 98.0°F | Wt 168.6 lb

## 2013-05-03 DIAGNOSIS — O99810 Abnormal glucose complicating pregnancy: Secondary | ICD-10-CM

## 2013-05-03 DIAGNOSIS — O09529 Supervision of elderly multigravida, unspecified trimester: Secondary | ICD-10-CM | POA: Insufficient documentation

## 2013-05-03 DIAGNOSIS — O34219 Maternal care for unspecified type scar from previous cesarean delivery: Secondary | ICD-10-CM

## 2013-05-03 LAB — POCT URINALYSIS DIP (DEVICE)
BILIRUBIN URINE: NEGATIVE
GLUCOSE, UA: NEGATIVE mg/dL
Hgb urine dipstick: NEGATIVE
KETONES UR: NEGATIVE mg/dL
Nitrite: NEGATIVE
Protein, ur: NEGATIVE mg/dL
SPECIFIC GRAVITY, URINE: 1.015 (ref 1.005–1.030)
Urobilinogen, UA: 0.2 mg/dL (ref 0.0–1.0)
pH: 6.5 (ref 5.0–8.0)

## 2013-05-03 NOTE — Progress Notes (Signed)
Patient is Spanish-speaking only, Spanish interpreter present for this encounter. Blood sugars are within range, continue diet control No other complaints or concerns.  Fetal movement and labor precautions reviewed.

## 2013-05-03 NOTE — Patient Instructions (Signed)
Regrese a la clinica cuando tenga su cita. Si tiene problemas o preguntas, llama a la clinica o vaya a la sala [facility] emergencia al [facility].    

## 2013-05-05 ENCOUNTER — Encounter: Payer: Self-pay | Admitting: *Deleted

## 2013-05-10 ENCOUNTER — Ambulatory Visit (INDEPENDENT_AMBULATORY_CARE_PROVIDER_SITE_OTHER): Payer: Medicaid Other | Admitting: Obstetrics & Gynecology

## 2013-05-10 VITALS — BP 97/58 | Temp 97.1°F | Wt 163.8 lb

## 2013-05-10 DIAGNOSIS — O99810 Abnormal glucose complicating pregnancy: Secondary | ICD-10-CM

## 2013-05-10 LAB — POCT URINALYSIS DIP (DEVICE)
Bilirubin Urine: NEGATIVE
Glucose, UA: NEGATIVE mg/dL
HGB URINE DIPSTICK: NEGATIVE
NITRITE: NEGATIVE
PH: 6.5 (ref 5.0–8.0)
PROTEIN: NEGATIVE mg/dL
Specific Gravity, Urine: 1.02 (ref 1.005–1.030)
UROBILINOGEN UA: 0.2 mg/dL (ref 0.0–1.0)

## 2013-05-10 NOTE — Progress Notes (Signed)
Fasting:  92,99,90,87,94,104,84,89  2 hr break 87,86,95,90,84,87,75  2 hr lunch 116,92,113,85,90,81,85  2 hr dinner 120,100,94,112,108,130,107 US last week 50% and 6 poundsish. Induce at 40 if not delivered for GDM A1

## 2013-05-10 NOTE — Progress Notes (Signed)
P=68  5 lbs weight loss in one week, denies any illness.

## 2013-05-16 ENCOUNTER — Inpatient Hospital Stay (HOSPITAL_COMMUNITY)
Admission: AD | Admit: 2013-05-16 | Discharge: 2013-05-18 | DRG: 775 | Disposition: A | Discharge status: Home / Self Care | Payer: Medicaid Other | Source: Ambulatory Visit | Attending: Obstetrics & Gynecology | Admitting: Obstetrics & Gynecology

## 2013-05-16 ENCOUNTER — Encounter (HOSPITAL_COMMUNITY): Payer: Self-pay | Admitting: *Deleted

## 2013-05-16 DIAGNOSIS — O34219 Maternal care for unspecified type scar from previous cesarean delivery: Secondary | ICD-10-CM

## 2013-05-16 DIAGNOSIS — Z3490 Encounter for supervision of normal pregnancy, unspecified, unspecified trimester: Secondary | ICD-10-CM

## 2013-05-16 DIAGNOSIS — Z789 Other specified health status: Secondary | ICD-10-CM

## 2013-05-16 DIAGNOSIS — O0990 Supervision of high risk pregnancy, unspecified, unspecified trimester: Secondary | ICD-10-CM

## 2013-05-16 DIAGNOSIS — O09529 Supervision of elderly multigravida, unspecified trimester: Secondary | ICD-10-CM | POA: Diagnosis present

## 2013-05-16 DIAGNOSIS — Z603 Acculturation difficulty: Secondary | ICD-10-CM

## 2013-05-16 DIAGNOSIS — O99810 Abnormal glucose complicating pregnancy: Secondary | ICD-10-CM

## 2013-05-16 LAB — CBC
HCT: 39.3 % (ref 36.0–46.0)
Hemoglobin: 13.5 g/dL (ref 12.0–15.0)
MCH: 31.8 pg (ref 26.0–34.0)
MCHC: 34.4 g/dL (ref 30.0–36.0)
MCV: 92.5 fL (ref 78.0–100.0)
PLATELETS: 196 10*3/uL (ref 150–400)
RBC: 4.25 MIL/uL (ref 3.87–5.11)
RDW: 14.1 % (ref 11.5–15.5)
WBC: 9.8 10*3/uL (ref 4.0–10.5)

## 2013-05-16 LAB — TYPE AND SCREEN
ABO/RH(D): O POS
ANTIBODY SCREEN: NEGATIVE

## 2013-05-16 MED ORDER — OXYTOCIN 40 UNITS IN LACTATED RINGERS INFUSION - SIMPLE MED
62.5000 mL/h | INTRAVENOUS | Status: DC
  Administered 2013-05-16: 62.5 mL/h via INTRAVENOUS
  Filled 2013-05-16: qty 1000

## 2013-05-16 MED ORDER — BENZOCAINE-MENTHOL 20-0.5 % EX AERO: 1.0000 "application " | INHALATION_SPRAY | CUTANEOUS | Status: DC | PRN

## 2013-05-16 MED ORDER — ONDANSETRON HCL 4 MG PO TABS: 4.0000 mg | ORAL_TABLET | ORAL | Status: DC | PRN

## 2013-05-16 MED ORDER — ZOLPIDEM TARTRATE 5 MG PO TABS: 5.0000 mg | ORAL_TABLET | Freq: Every evening | ORAL | Status: DC | PRN

## 2013-05-16 MED ORDER — TETANUS-DIPHTH-ACELL PERTUSSIS 5-2.5-18.5 LF-MCG/0.5 IM SUSP: 0.5000 mL | Freq: Once | INTRAMUSCULAR | Status: DC

## 2013-05-16 MED ORDER — IBUPROFEN 600 MG PO TABS
600.0000 mg | ORAL_TABLET | Freq: Four times a day (QID) | ORAL | Status: DC | PRN
  Administered 2013-05-16: 600 mg via ORAL
  Filled 2013-05-16: qty 1

## 2013-05-16 MED ORDER — ACETAMINOPHEN 325 MG PO TABS: 650.0000 mg | ORAL_TABLET | ORAL | Status: DC | PRN

## 2013-05-16 MED ORDER — SENNOSIDES-DOCUSATE SODIUM 8.6-50 MG PO TABS
2.0000 | ORAL_TABLET | ORAL | Status: DC
  Administered 2013-05-17: 2 via ORAL
  Filled 2013-05-16: qty 2

## 2013-05-16 MED ORDER — ONDANSETRON HCL 4 MG/2ML IJ SOLN: 4.0000 mg | INTRAMUSCULAR | Status: DC | PRN

## 2013-05-16 MED ORDER — LACTATED RINGERS IV SOLN: INTRAVENOUS | Status: DC

## 2013-05-16 MED ORDER — LACTATED RINGERS IV SOLN: 500.0000 mL | INTRAVENOUS | Status: DC | PRN

## 2013-05-16 MED ORDER — SIMETHICONE 80 MG PO CHEW: 80.0000 mg | CHEWABLE_TABLET | ORAL | Status: DC | PRN

## 2013-05-16 MED ORDER — FLEET ENEMA 7-19 GM/118ML RE ENEM: 1.0000 | ENEMA | RECTAL | Status: DC | PRN

## 2013-05-16 MED ORDER — WITCH HAZEL-GLYCERIN EX PADS: 1.0000 "application " | MEDICATED_PAD | CUTANEOUS | Status: DC | PRN

## 2013-05-16 MED ORDER — LIDOCAINE HCL (PF) 1 % IJ SOLN
30.0000 mL | INTRAMUSCULAR | Status: DC | PRN
  Filled 2013-05-16: qty 30

## 2013-05-16 MED ORDER — DIBUCAINE 1 % RE OINT: 1.0000 "application " | TOPICAL_OINTMENT | RECTAL | Status: DC | PRN

## 2013-05-16 MED ORDER — PRENATAL MULTIVITAMIN CH
1.0000 | ORAL_TABLET | Freq: Every day | ORAL | Status: DC
  Administered 2013-05-17 – 2013-05-18 (×2): 1 via ORAL
  Filled 2013-05-16 (×2): qty 1

## 2013-05-16 MED ORDER — LANOLIN HYDROUS EX OINT: TOPICAL_OINTMENT | CUTANEOUS | Status: DC | PRN

## 2013-05-16 MED ORDER — OXYCODONE-ACETAMINOPHEN 5-325 MG PO TABS: 1.0000 | ORAL_TABLET | ORAL | Status: DC | PRN

## 2013-05-16 MED ORDER — ONDANSETRON HCL 4 MG/2ML IJ SOLN: 4.0000 mg | Freq: Four times a day (QID) | INTRAMUSCULAR | Status: DC | PRN

## 2013-05-16 MED ORDER — IBUPROFEN 600 MG PO TABS
600.0000 mg | ORAL_TABLET | Freq: Four times a day (QID) | ORAL | Status: DC
  Administered 2013-05-17 – 2013-05-18 (×6): 600 mg via ORAL
  Filled 2013-05-16 (×6): qty 1

## 2013-05-16 MED ORDER — DIPHENHYDRAMINE HCL 25 MG PO CAPS: 25.0000 mg | ORAL_CAPSULE | Freq: Four times a day (QID) | ORAL | Status: DC | PRN

## 2013-05-16 MED ORDER — MEASLES, MUMPS & RUBELLA VAC ~~LOC~~ INJ
0.5000 mL | INJECTION | Freq: Once | SUBCUTANEOUS | Status: DC
  Filled 2013-05-16: qty 0.5

## 2013-05-16 MED ORDER — CITRIC ACID-SODIUM CITRATE 334-500 MG/5ML PO SOLN: 30.0000 mL | ORAL | Status: DC | PRN

## 2013-05-16 MED ORDER — OXYTOCIN BOLUS FROM INFUSION
500.0000 mL | INTRAVENOUS | Status: DC
  Administered 2013-05-16: 500 mL via INTRAVENOUS

## 2013-05-16 MED ORDER — OXYCODONE-ACETAMINOPHEN 5-325 MG PO TABS
1.0000 | ORAL_TABLET | ORAL | Status: DC | PRN
  Administered 2013-05-17 (×2): 1 via ORAL
  Filled 2013-05-16 (×2): qty 1

## 2013-05-16 NOTE — H&P (Signed)
LABOR ADMISSION HISTORY AND PHYSICAL  Anna Higgins is a 38 y.o. female [phone] with IUP at 409w0d presenting for active labor at term. Contractions started at 9am and got stronger at noon. Doesn't believe her water has broken. +FM. No VB.    [facility] a [facility] from [facility] for A1DM. Sugars have been well controlled. US at 36 weeks with 6lb1oz and 56%ile.  Hx of c/s with first baby for unclear reasons but no trial of labor and 2 VBACs after.    Prenatal History/Complications:  Past Medical History: Past Medical History  Diagnosis Date  . No pertinent past medical history   . Pregnancy induced hypertension     Past Surgical History: Past Surgical History  Procedure Laterality Date  . Cesarean section      Obstetrical History: OB History   Grav Para Term Preterm Abortions TAB SAB Ect Mult Living   4 3 3  0 0 0 0 0 0 3      Social History: History   Social History  . Marital Status: Single    Spouse Name: N/A    Number of Children: N/A  . Years of Education: N/A   Social History Main Topics  . Smoking status: Never Smoker   . Smokeless tobacco: Never Used  . Alcohol Use: No  . Drug Use: No  . Sexual Activity: Not Currently    Birth Control/ Protection: None   Other Topics Concern  . None   Social History Narrative  . None    Family History: History reviewed. No pertinent family history.  Allergies: No Known Allergies  Prescriptions prior to admission  Medication Sig Dispense Refill  . Prenatal Vit-Fe Fumarate-FA (MULTIVITAMIN-PRENATAL) 27-0.8 MG TABS tablet Take 1 tablet by mouth daily at 12 noon.  30 each  5     Review of Systems   All systems reviewed and negative except as stated in HPI  Blood pressure 135/69, pulse 57, temperature 97.7 F (36.5 C), resp. rate 18, height 5' (1.524 m), weight 75.297 kg (166 lb), last menstrual period 08/23/2012. General appearance: alert, cooperative and no distress Lungs: clear to  auscultation bilaterally Heart: regular rate and rhythm Abdomen: soft, non-tender; bowel sounds normal Extremities: Homans sign is negative, no sign of DVT  Presentation: cephalic by nurse check Fetal monitoringBaseline: 150 bpm, Variability: Good {> 6 bpm), Accelerations: Reactive and Decelerations: Absent Uterine activity contractions every 2-463min  Dilation: 9 Effacement (%): 100 Station: -1 Exam by:: Sharen Hintaroline Brewer RN   Prenatal labs: ABO, Rh:   Antibody:   Rubella:   RPR: Nonreactive (02/23 0000)  HBsAg:    HIV: Non-reactive (12/08 0000)  GBS: Negative (04/13 0000)  1 hr Glucola 135 with failed 3 hour Genetic screening  normal Anatomy US normal    No results found for this or any previous visit (from the past 24 hour(s)).  Assessment: Anna Higgins is a 38 y.o. [id number] at 389w0d here for active labor at term   #Labor: progressing well. Cont to monitor. Anticipate delivery #Pain: Desires natural childbirth #A1DM: FSBG on admission and prior to delivery ordered  #FWB: Cat I tracing #ID:  GBS neg #MOF: breast #MOC: paraguard #Circ:  n/a  Sharne Linders L Eadie Repetto 05/16/2013, 5:12 PM

## 2013-05-16 NOTE — Progress Notes (Signed)
Notified of pt cervical exam. Received verbal orders to admit patient to labor and delivery

## 2013-05-16 NOTE — MAU Note (Signed)
Pt presents to MAU with complaints of contractions that started yesterday but have gotten regular throughout the day. States some Vaginal spotting with discharge yesterday.

## 2013-05-16 NOTE — MAU Note (Signed)
Report called to birthing suites charge RN. Will go to 168

## 2013-05-17 ENCOUNTER — Encounter: Payer: Medicaid Other | Admitting: Obstetrics & Gynecology

## 2013-05-17 LAB — RPR

## 2013-05-17 NOTE — Progress Notes (Signed)
Post Partum Day 1 Subjective: no complaints, up ad lib, voiding and tolerating PO  Objective: Blood pressure 114/54, pulse 51, temperature 97.6 F (36.4 C), temperature source Oral, resp. rate 18, height 5' (1.524 m), weight 75.297 kg (166 lb), last menstrual period 08/23/2012, unknown if currently breastfeeding.  Physical Exam:  General: alert, cooperative and no distress Lochia: appropriate Uterine Fundus: firm Incision: na DVT Evaluation: No cords or calf tenderness. No significant calf/ankle edema.   Recent Labs  05/16/13 1705  HGB 13.5  HCT 39.3    Assessment/Plan: Plan for discharge tomorrow, Breastfeeding, Lactation consult and Contraception paraguard   LOS: 1 day   Anna Higgins 05/17/2013, 8:04 AM

## 2013-05-17 NOTE — Lactation Note (Signed)
This note was copied from the chart of Anna Trana Higgins. Lactation Consultation Note Experienced BF mom. Her 4th baby. Youngest child is 8615 months old. Mom BF that child for 6 month d/t had to go back to work. BF in cradle position holding breast in scissor hold. Baby swaddled in 2 blankets and has hat on. Encouraged mom not to have baby so swaddled when BF d/t will have shorter feedings because baby will be warm. Mom has limited [place], but every question I asked her she explained in [place]. Gave brochure in Spanish of out patient services and support groups. Gave mom a pillow for arm support, encouraged to be comfortable during feeding. Mom thanked me for my help. Encouraged to call if needed any assistance. States feeding are going well so far. Patient Name: Anna Higgins Today's Date: 05/17/2013 Reason for consult: Initial assessment   Maternal Data    Feeding Feeding Type: Breast Fed Length of feed: 20 min  LATCH Score/Interventions Latch: Grasps breast easily, tongue down, lips flanged, rhythmical sucking.  Audible Swallowing: A few with stimulation Intervention(s): Hand expression  Type of Nipple: Everted at rest and after stimulation  Comfort (Breast/Nipple): Soft / non-tender     Hold (Positioning): No assistance needed to correctly position infant at breast.  LATCH Score: 9  Lactation Tools Discussed/Used     Consult Status Consult Status: Follow-up Date: 05/18/13 Follow-up type: In-patient    Anna Higgins 05/17/2013, 5:07 AM

## 2013-05-17 NOTE — H&P (Signed)
Attestation of Attending Supervision of Fellow: Evaluation and management procedures were performed by the Fellow under my supervision and collaboration.  I have reviewed the Fellow's note and chart, and I agree with the management and plan.    

## 2013-05-17 NOTE — Lactation Note (Signed)
This note was copied from the chart of Anna Higgins. Lactation Consultation Note Used interpreter to verify information and to see if she had any questions or needed anything. Stated everything was fine, BF going well, had no questions.  Patient Name: Anna Franny Higgins Today's Date: 05/17/2013 Reason for consult: Initial assessment   Maternal Data    Feeding Feeding Type: Breast Fed Length of feed: 20 min  LATCH Score/Interventions Latch: Grasps breast easily, tongue down, lips flanged, rhythmical sucking.  Audible Swallowing: A few with stimulation Intervention(s): Hand expression  Type of Nipple: Everted at rest and after stimulation  Comfort (Breast/Nipple): Soft / non-tender     Hold (Positioning): No assistance needed to correctly position infant at breast.  LATCH Score: 9  Lactation Tools Discussed/Used     Consult Status Consult Status: Follow-up Date: 05/18/13 Follow-up type: In-patient    Charyl DancerLaura G Jerrin Recore 05/17/2013, 5:31 AM

## 2013-05-17 NOTE — Lactation Note (Signed)
This note was copied from the chart of Anna Higgins. Lactation Consultation Note  Patient Name: Anna Pearlena Higgins ZOXWR'UToday's Date: 05/17/2013 Reason for consult: Follow-up assessment of this experienced multipara who breastfed all 3 other babies for 6 months prior to return to work. Mom states she knows how to express colostrum by hand and LC demonstrated and reviewed with mom, via interpreter, "Jessica": hand expression technique, STS and cue feeding recommendations,  all basic BF information in Baby and Me, pp 13-16 and LC Resource packet, including website for llli which has access to Spanish BF information.  Mom denies any current breastfeeding problems and baby is exclusively breastfeeding with consistent LATCH scores of 9, feedings of 20-40 minutes each and output wnl for this hour of life. Mom encouraged to feed baby 8-12 times/24 hours and with feeding cues. LC provided [employer] Resource brochure in Spanish, and reviewed WH services and list of community and web site resources, especially [facility] website which has information available in [place]..   Maternal Data Formula Feeding for Exclusion: No Infant to breast within first hour of birth: Yes Has patient been taught Hand Expression?: Yes (LC demonstrated and reviewed) Does the patient have breastfeeding experience prior to this delivery?: Yes  Feeding Feeding Type: Breast Fed Length of feed: 30 min  LATCH Score/Interventions         Consistent LATCH scores=9             Lactation Tools Discussed/Used   STS, hand expression, cue feedings  Consult Status Consult Status: Follow-up Date: 05/18/13 Follow-up type: In-patient    Zara ChessJoanne P Hikeem Andersson 05/17/2013, 5:56 PM

## 2013-05-17 NOTE — Progress Notes (Signed)
Ur chart review completed.  

## 2013-05-18 ENCOUNTER — Encounter: Payer: Self-pay | Admitting: Obstetrics & Gynecology

## 2013-05-18 MED ORDER — IBUPROFEN 600 MG PO TABS: 600.0000 mg | ORAL_TABLET | Freq: Four times a day (QID) | ORAL | Status: AC

## 2013-05-18 NOTE — Lactation Note (Signed)
This note was copied from the chart of Anna Syniyah Higgins. Lactation Consultation Note  Patient Name: Anna Higgins WNUUV'OToday's Date: 05/18/2013 Reason for consult: Follow-up assessment [employer] [phone] used for visit. Mom reports baby is BF well, denies questions or concerns. Cluster feeding reviewed. Engorgement care reviewed. Hand pump given and demonstrated, changed flange to size 27. Aware of OP services and support group.   Maternal Data    Feeding Feeding Type: Breast Fed Length of feed: 15 min  LATCH Score/Interventions Latch: Grasps breast easily, tongue down, lips flanged, rhythmical sucking.  Audible Swallowing: A few with stimulation  Type of Nipple: Everted at rest and after stimulation  Comfort (Breast/Nipple): Soft / non-tender     Hold (Positioning): No assistance needed to correctly position infant at breast.  LATCH Score: 9  Lactation Tools Discussed/Used Tools: Pump;Flanges Flange Size: 27 Breast pump type: Manual   Consult Status Consult Status: Complete Date: 05/18/13 Follow-up type: In-patient    Alfred LevinsKathy Ann Erinne Gillentine 05/18/2013, 10:57 AM

## 2013-05-18 NOTE — Discharge Summary (Signed)
Attestation of Attending Supervision of Fellow: Evaluation and management procedures were performed by the Fellow under my supervision and collaboration.  I have reviewed the Fellow's note and chart, and I agree with the management and plan.    

## 2013-05-18 NOTE — Discharge Summary (Signed)
Obstetric Discharge Summary Reason for Admission: onset of labor Prenatal Procedures: none Intrapartum Procedures: spontaneous vaginal delivery Postpartum Procedures: none Complications-Operative and Postpartum: none Hemoglobin  Date Value Ref Range Status  05/16/2013 13.5  12.0 - 15.0 g/dL Final  4/54/09812/23/2015 19.111.6   Final     HCT  Date Value Ref Range Status  05/16/2013 39.3  36.0 - 46.0 % Final  03/08/2013 36   Final    Discharge Diagnoses: Term Pregnancy-delivered  Hospital Course:  Anna Higgins is a 38 y.o. [id number] who presented with onset of labor.  She had a uncomplicated SVD. She was able to ambulate, tolerate PO and void normally. She was discharged home with instructions for postpartum care.    Delivery Note  At 7:19 PM a viable female was delivered via Vaginal, Spontaneous Delivery (Presentation: Left Occiput Anterior). APGAR: 8, 9; weight TBD.  Placenta status: Intact, Spontaneous. Cord: 3 vessels with the following complications: .  Anesthesia: None  Episiotomy: None  Lacerations: None  Suture Repair: na  Est. Blood Loss (mL): 400  Mom to postpartum. Baby to Couplet care / Skin to Skin.  Pt pushed with good maternal effort to deliver a liveborn female via NSVD with spontaneous cry. Baby placed on maternal abdomen. Delayed cord clamping performed. Cord cut by FOB. Placenta delivered intact with 3V cord via traction and pitocin. no tears. No complications. Mom and baby to postpartum.  Keli L Beck  05/16/2013, 8:36 PM   Physical Exam:  Filed Vitals:   05/18/13 0550  BP: 99/62  Pulse: 55  Temp: 97.9 F (36.6 C)  Resp: 18    General: alert, cooperative and no distress Lochia: appropriate Uterine Fundus: firm DVT Evaluation: No evidence of DVT seen on physical exam. Negative Homan's sign. No cords or calf tenderness.  Discharge Information: Date: 05/18/2013 Activity: pelvic rest Diet: routine Medications: Motrin and prenatal vitamins Baby feeding: plans  to breastfeed Contraception: IUD Condition: stable Instructions: refer to practice specific booklet Discharge to: home   Newborn Data: Live born female  Birth Weight: 6 lb 9.8 oz (3000 g) APGAR: 8, 9  Home with mother.  Lawernce Pittsrew Venables, PA-S1 05/18/2013, 9:23 AM  I examined pt and agree with documentation above and PA-S plan of care. Eino FarberWalidah Paul HalfN Muhammad, CNM

## 2013-05-18 NOTE — Discharge Instructions (Signed)
Cuidados luego de un parto por vía vaginal °(Postpartum Care After Vaginal Delivery) °Luego del nacimiento del bebé deberá permanecer en el hospital durante 24 a 72 horas, excepto que hubiera existido algún problema, o usted sufra alguna enfermedad. Mientras se encuentre en el hospital recibirá ayuda e instrucciones por parte de las enfermeras y el médico, quienes cuidarán de usted y su bebé y le darán consejos para amamantarlo correctamente, especialmente si es el primer hijo.  °En caso de ser necesario, le prescribirán analgésicos. Observará una pequeña hemorragia vaginal y deberá cambiar los apósitos con frecuencia. Lávese las manos cuidadosamente con agua y jabón durante al menos 20 segundos luego de cambiarse el apósito o ir al baño. Si elimina coágulos o aumenta la hemorragia, infórmelo a la enfermera. No deseche los coágulos sanguíneos antes de mostrárselos a la enfermera, para asegurarse de que no es tejido placentario. °Si le han colocado una vía intravenosa, se la retirarán dentro de las 24 horas, si no hay problemas. La primera vez que se levante de la cama o tome una ducha, llame a la enfermera para que la ayude que puede sentirse débil, mareada o desmayarse. Si está amamantando, puede sentir contracciones dolorosas en el útero durante algunas semanas. Esto es normal y necesario, ya que de este modo el útero vuelve a su tamaño normal. Si no está amamantando, utilice un sostén de soporte y trate de no tocarse las mamas hasta que haya dejado de producir leche. No deben administrarse hormonas para suprimir la leche, debido a que pueden causar coágulos sanguíneos. Podrá seguir una dieta normal, excepto que sufra diabetes o presente otros problemas de salud.  °La enfermera colocará bolsas con hielo en el sitio de la episiotomía (agrandamiento quirúrgico de la apertura vaginal) para reducir el dolor y la hinchazón. En algunos casos raros hay dificultad para orinar, entonces la enfermera deberá vaciarle la  vejiga con un catéter. Si le han practicado una ligadura tubaria durante el posparto ("trompas atadas", esterilización femenina), esto no hará que permanezca más tiempo en el hospital. °Podrá tener al bebé en su habitación todo el tiempo que lo desee si el bebé no tiene ningún problema. Lleve y traiga al bebé de la nursery dentro de la cunita. No lo lleve en brazos. No abandone el área de posparto. Si la madre es Rh negativa (falta de una proteína en los glóbulos rojos) y el bebé es Rh positivo, la madre debe aplicarse la vacuna RhoGam para evitar problemas con el factor Rh en futuros embarazos °Le darán instrucciones por escrito para usted y el bebé y los medicamentos necesarios cuando reciba el alta médica. Asegúrese que comprende y sigue las indicaciones. °INSTRUCCIONES PARA EL CUIDADO DOMICILIARIO °· Siga las instrucciones y tome los medicamentos que le indicaron cuando le dieron el alta médica.  °· Utilice los medicamentos de venta libre o de prescripción para el dolor, el malestar o la fiebre, según se lo indique el profesional que lo asiste.  °· No tome aspirina, ya que puede causar hemorragias.  °· Aumente sus actividades un poco cada día para tener más fuerza y resistencia.  °· No beba alcohol, especialmente si está amamantando o toma analgésicos.  °· Tómese la temperatura dos veces por día y regístrela.  °· Podrá tener una pequeña hemorragia durante 2 a 4 semanas. Esto es normal.  °· No utilice tampones o duchas vaginales, use toallas higiénicas.  °· Trate de que alguna persona permanezca con usted y la ayude durante los primeros días en el hogar.  °·   Descanse o duerma una siesta cuando el bebé duerma.  °· Si está amamantando, use un buen sostén. Si no está amamantando, use un buen sostén y no estimule los pezones.  °· Consuma una dieta sana y siga tomando las vitaminas prenatales.  °· No conduzca vehículos, no realice actividades pesadas ni viaje hasta que su médico la autorice.  °· No mantenga relaciones  sexuales hasta que el médico lo permita.  °· Consulte con el profesional cuando puede comenzar a realizar actividad física y que tipo de ejercicios puede hacer.  °· Comuníquese inmediatamente con el médico si tiene problemas luego del parto.  °· Comuníquese con el pediatra si tiene problemas con el bebé.  °· Programe su visita de control luego del parto y cúmplala.  °SOLICITE ATENCIÓN MÉDICA SI: °· La temperatura se eleva por encima de 100° F (37.8° C).  °· Aumenta la hemorragia vaginal o elimina coágulos. Conserve algunos coágulos para mostrárselos al médico.  °· Observa sangre o siente dolor al orinar.  °· Presenta secreción vaginal con olor fétido.  °· Aumenta el dolor o la inflamación en el sitio de la episiotomía (agrandamiento quirúrgico de la apertura vaginal).  °· Sufre una cefalea grave.  °· Se siente deprimida.  °· La incisión se abre.  °· Se siente mareada o sufre un desmayo.  °· Aparece una erupción cutánea.  °· Tiene una reacción o problemas con su medicamento.  °· Siente dolor u observa enrojecimiento e hinchazón en el sitio de la vía intravenosa.  °SOLICITE ATENCIÓN MÉDICA DE INMEDIATO SI: °· Siente dolor en el pecho.  °· Comienza a sentir falta de aire.  °· Se desmaya.  °· Siente dolor, con o sin hinchazón e irritación en la pierna.  °· Tiene una hemorragia vaginal abundante, con o sin coágulos  °· Siente dolor en el estómago.  °· Observa una secreción vaginal con mal olor.  °ASEGURESE QUE:  °· Comprende estas instrucciones.  °· Controlará su enfermedad.  °· Solicitará ayuda de inmediato si no mejora o empeora.  °Document Released: 10/28/2006 Document Revised: 12/20/2010 °Exit[employer]® Patient Information ©2012 [employer]. °

## 2013-06-24 ENCOUNTER — Ambulatory Visit (INDEPENDENT_AMBULATORY_CARE_PROVIDER_SITE_OTHER): Payer: Medicaid Other | Admitting: Obstetrics and Gynecology

## 2013-06-24 NOTE — Progress Notes (Signed)
  Subjective:     Anna Higgins is a 38 y.o. female who presents for a postpartum visit. She is 5 weeks postpartum following a VBAC. I have fully reviewed the prenatal and intrapartum course. The delivery was at 39 gestational weeks. Outcome: vaginal birth after cesarean (VBAC). Anesthesia: none. Postpartum course has been uncomplicated. Baby's course has been uncomplicated. Baby is feeding by breast. Bleeding staining only. Bowel function is normal. Bladder function is normal. Patient is not sexually active. Contraception method is none. Postpartum depression screening: negative.     Review of Systems A comprehensive review of systems was negative.   Objective:    BP 123/82  Pulse 68  Ht 5\' 2"  (1.575 m)  Wt 153 lb 11.2 oz (69.718 kg)  BMI 28.11 kg/m2  General:  alert, cooperative and no distress   Breasts:  inspection negative, no nipple discharge or bleeding, no masses or nodularity palpable  Lungs: clear to auscultation bilaterally  Heart:  regular rate and rhythm  Abdomen: soft, non-tender; bowel sounds normal; no masses,  no organomegaly   Vulva:  normal  Vagina: normal vagina, no discharge, exudate, lesion, or erythema  Cervix:  multiparous appearance  Corpus: normal size, contour, position, consistency, mobility, non-tender  Adnexa:  normal adnexa and no mass, fullness, tenderness  Rectal Exam: Not performed.        Assessment:     Normal postpartum exam. Pap smear not done at today's visit.   Plan:    1. Contraception: IUD- will obtain from health department 2.Patient medically cleared to resume all activities of daily living 3. Patient with GDM during pregnancy and will return for 2 hour glucola

## 2013-07-01 ENCOUNTER — Encounter: Payer: Self-pay | Admitting: Obstetrics and Gynecology

## 2013-07-01 ENCOUNTER — Other Ambulatory Visit: Payer: Medicaid Other

## 2013-07-01 DIAGNOSIS — R7309 Other abnormal glucose: Secondary | ICD-10-CM

## 2013-07-02 LAB — GLUCOSE TOLERANCE, 2 HOURS
GLUCOSE, 2 HOUR: 101 mg/dL (ref 70–139)
GLUCOSE, FASTING: 77 mg/dL (ref 70–99)

## 2013-07-05 ENCOUNTER — Telehealth: Payer: Self-pay

## 2013-07-05 NOTE — Telephone Encounter (Signed)
Called patient with interpreter Marlynn PerkingMaria Elena. Informed patient of results. Patient verbalized understanding and gratitude. No further questions or concerns.

## 2013-07-05 NOTE — Telephone Encounter (Signed)
Message copied by Louanna RawAMPBELL, TAYLOR M on Mon Jul 05, 2013 10:53 AM ------      Message from: Vale HavenBECK, KELI L      Created: Fri Jul 02, 2013 11:42 AM       Normal 2 hour. Please let her know she does not have diabetes. Thanks! ------

## 2013-11-15 ENCOUNTER — Encounter (HOSPITAL_COMMUNITY): Payer: Self-pay | Admitting: *Deleted

## 2014-08-10 ENCOUNTER — Other Ambulatory Visit (HOSPITAL_COMMUNITY): Payer: Self-pay | Admitting: Nurse Practitioner

## 2014-08-10 DIAGNOSIS — Z1231 Encounter for screening mammogram for malignant neoplasm of breast: Secondary | ICD-10-CM

## 2014-08-23 ENCOUNTER — Ambulatory Visit (HOSPITAL_COMMUNITY)
Admission: RE | Admit: 2014-08-23 | Discharge: 2014-08-23 | Disposition: A | Discharge status: Home / Self Care | Payer: Medicaid Other | Source: Ambulatory Visit | Attending: Nurse Practitioner | Admitting: Nurse Practitioner

## 2014-08-23 DIAGNOSIS — Z1231 Encounter for screening mammogram for malignant neoplasm of breast: Secondary | ICD-10-CM

## 2015-08-28 ENCOUNTER — Other Ambulatory Visit: Payer: Self-pay | Admitting: Nurse Practitioner

## 2015-08-28 DIAGNOSIS — Z1231 Encounter for screening mammogram for malignant neoplasm of breast: Secondary | ICD-10-CM

## 2015-09-04 ENCOUNTER — Ambulatory Visit
Admission: RE | Admit: 2015-09-04 | Discharge: 2015-09-04 | Disposition: A | Discharge status: Home / Self Care | Payer: No Typology Code available for payment source | Source: Ambulatory Visit | Attending: Nurse Practitioner | Admitting: Nurse Practitioner

## 2015-09-04 DIAGNOSIS — Z1231 Encounter for screening mammogram for malignant neoplasm of breast: Secondary | ICD-10-CM

## 2017-09-26 ENCOUNTER — Other Ambulatory Visit: Payer: Self-pay | Admitting: Obstetrics and Gynecology

## 2017-09-26 DIAGNOSIS — Z1231 Encounter for screening mammogram for malignant neoplasm of breast: Secondary | ICD-10-CM

## 2017-12-09 ENCOUNTER — Ambulatory Visit
Admission: RE | Admit: 2017-12-09 | Discharge: 2017-12-09 | Disposition: A | Discharge status: Home / Self Care | Payer: No Typology Code available for payment source | Source: Ambulatory Visit | Attending: Obstetrics and Gynecology | Admitting: Obstetrics and Gynecology

## 2017-12-09 ENCOUNTER — Encounter (HOSPITAL_COMMUNITY): Payer: Self-pay

## 2017-12-09 ENCOUNTER — Ambulatory Visit (HOSPITAL_COMMUNITY)
Admission: RE | Admit: 2017-12-09 | Discharge: 2017-12-09 | Disposition: A | Discharge status: Home / Self Care | Payer: No Typology Code available for payment source | Source: Ambulatory Visit | Attending: Obstetrics and Gynecology | Admitting: Obstetrics and Gynecology

## 2017-12-09 VITALS — BP 110/78 | Wt 193.0 lb

## 2017-12-09 DIAGNOSIS — Z1231 Encounter for screening mammogram for malignant neoplasm of breast: Secondary | ICD-10-CM

## 2017-12-09 DIAGNOSIS — Z1239 Encounter for other screening for malignant neoplasm of breast: Secondary | ICD-10-CM

## 2017-12-09 NOTE — Patient Instructions (Addendum)
Explained breast self awareness with Anna Higgins. Patient did not need a Pap smear today due to last Pap smear was in August 2018 per patient. Let her know B[facility]CP will cover Pap smears every 3 years unless has a history of abnormal Pap smears. [facility]. Appointment scheduled for Tuesday, December 09, 2017 at 1110. Patient aware of appointment and will be there. Let patient know the Breast Center will follow up with her within the next couple weeks with results of mammogram by letter or phone. Anna Higgins verbalized understanding.  , Kathaleen Maserhristine Poll, RN 12:12 PM

## 2017-12-09 NOTE — Progress Notes (Signed)
No complaints today.   Pap Smear: Pap smear not completed today. [facility] and normal per patient. Per patient has no history of an abnormal Pap smear. Last Pap smear result is not in Epic. Patients previous Pap smear result from 11/29/2010 in Epic.  Physical exam: Breasts Breasts symmetrical. No skin abnormalities bilateral breasts. No nipple retraction bilateral breasts. No nipple discharge bilateral breasts. No lymphadenopathy. No lumps palpated bilateral breasts. No complaints of pain or tenderness on exam. [facility] for a screening mammogram. Appointment scheduled for Tuesday, December 09, 2017 at 1110.        Pelvic/Bimanual No Pap smear completed today since last Pap smear was in August 2018 per patient. Pap smear not indicated per BCCCP guidelines.   Smoking History: Patient has never smoked.  Patient Navigation: Patient education provided. [facility]. Spanish interpreter provided.   Breast and Cervical Cancer Risk Assessment: Patient has no family history of breast cancer, known genetic mutations, or radiation treatment to the chest before age 43. Patient has no history of cervical dysplasia, immunocompromised, or DES exposure in-utero.  Risk Assessment    Risk Scores      12/09/2017   Last edited by: Lynnell DikeHolland, Sabrina H, LPN   5-year risk: 0.5 %   Lifetime risk: 7.7 %         Used Spanish interpreter Natale LayErika McReynolds from [facility].

## 2017-12-15 ENCOUNTER — Encounter (HOSPITAL_COMMUNITY): Payer: Self-pay | Admitting: *Deleted

## 2018-09-03 ENCOUNTER — Other Ambulatory Visit (HOSPITAL_COMMUNITY): Payer: Self-pay | Admitting: *Deleted

## 2018-09-03 DIAGNOSIS — Z1231 Encounter for screening mammogram for malignant neoplasm of breast: Secondary | ICD-10-CM

## 2018-12-01 ENCOUNTER — Ambulatory Visit (HOSPITAL_COMMUNITY): Payer: No Typology Code available for payment source

## 2018-12-24 ENCOUNTER — Other Ambulatory Visit: Payer: Self-pay

## 2018-12-24 ENCOUNTER — Encounter (HOSPITAL_COMMUNITY): Payer: Self-pay

## 2018-12-24 ENCOUNTER — Ambulatory Visit (HOSPITAL_COMMUNITY)
Admission: RE | Admit: 2018-12-24 | Discharge: 2018-12-24 | Disposition: A | Discharge status: Home / Self Care | Payer: No Typology Code available for payment source | Source: Ambulatory Visit | Attending: Obstetrics and Gynecology | Admitting: Obstetrics and Gynecology

## 2018-12-24 DIAGNOSIS — Z1239 Encounter for other screening for malignant neoplasm of breast: Secondary | ICD-10-CM | POA: Insufficient documentation

## 2018-12-24 NOTE — Progress Notes (Signed)
No complaints today.   Pap Smear: Pap smear not completed today. Last Pap smear was in August 2018 at the [facility] and normal per patient. Per patient has no history of an abnormal Pap smear. Last Pap smear result is not in Epic. Patients previous Pap smear result from 11/29/2010 in Epic.  Physical exam: Breasts Breasts symmetrical. No skin abnormalities bilateral breasts. No nipple retraction bilateral breasts. No nipple discharge bilateral breasts. No lymphadenopathy. No lumps palpated bilateral breasts. No complaints of pain or tenderness on exam. Referred patient to the [facility] for a screening mammogram. Appointment scheduled for Tuesday, December 29, 2018 at 0930.        Pelvic/Bimanual No Pap smear completed today since last Pap smear was in August 2018 per patient. Pap smear not indicated per BCCCP guidelines.   Smoking History: Patient has never smoked.  Patient Navigation: Patient education provided. Access to services provided for patient through [facility] program. Spanish interpreter provided.   Breast and Cervical Cancer Risk Assessment: Patient has no family history of breast cancer, known genetic mutations, or radiation treatment to the chest before age 37. Patient has no history of cervical dysplasia, immunocompromised, or DES exposure in-utero.  Risk Assessment    Risk Scores      12/24/2018 12/09/2017   Last edited by: Loletta Parish, RN Armond Hang, LPN   5-year risk: 0.6 % 0.5 %   Lifetime risk: 7.6 % 7.7 %         Used Spanish interpreter Rudene Anda from [place].

## 2018-12-24 NOTE — Patient Instructions (Signed)
Explained breast self awareness with Anna Higgins. Patient did not need a Pap smear today due to last Pap smear was in August 2018 per patient. Let her know BCCCP will cover Pap smears every 3 years unless has a history of abnormal Pap smears. Referred patient to the [facility] for a screening mammogram. Appointment scheduled for Tuesday, December 29, 2018 at 0930. Patient aware of appointment and will be there. Let patient know the Breast Center will follow up with her within the next couple weeks with results of mammogram by letter or phone. Anna Higgins verbalized understanding.  Arkel Cartwright, Arvil Chaco, RN 12:43 PM

## 2018-12-29 ENCOUNTER — Ambulatory Visit
Admission: RE | Admit: 2018-12-29 | Discharge: 2018-12-29 | Disposition: A | Discharge status: Home / Self Care | Payer: No Typology Code available for payment source | Source: Ambulatory Visit | Attending: Obstetrics and Gynecology | Admitting: Obstetrics and Gynecology

## 2018-12-29 ENCOUNTER — Other Ambulatory Visit: Payer: Self-pay

## 2018-12-29 DIAGNOSIS — Z1231 Encounter for screening mammogram for malignant neoplasm of breast: Secondary | ICD-10-CM

## 2019-12-07 IMAGING — MG DIGITAL SCREENING BILATERAL MAMMOGRAM WITH TOMO AND CAD
8 series · 9 of 24 positions shown · non-contrast
Comparison: Previous exam(s).

CLINICAL DATA: Screening.

EXAM:
DIGITAL SCREENING BILATERAL MAMMOGRAM WITH TOMO AND CAD

[R CC synth-2D]
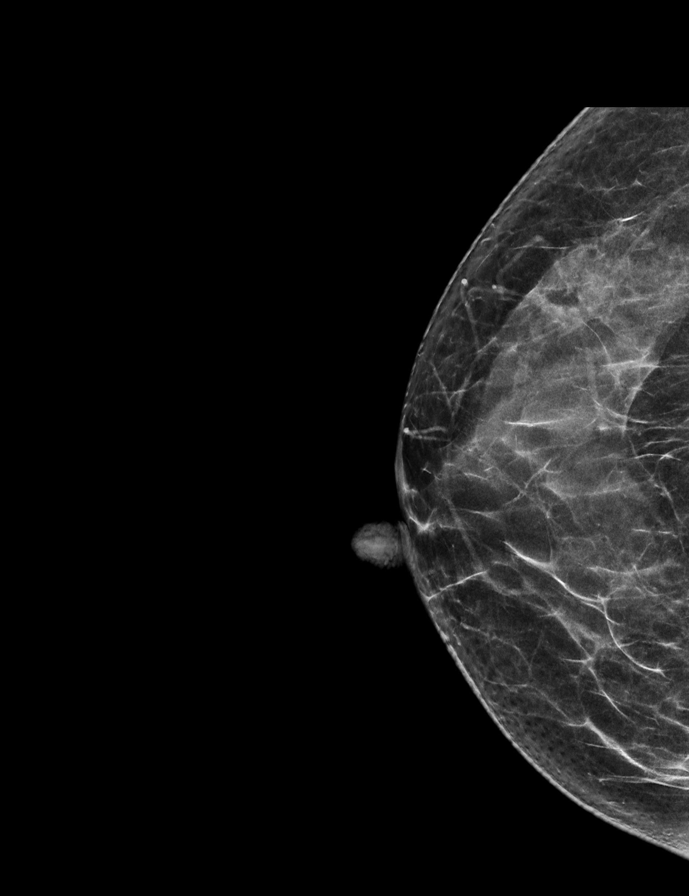

[L CC synth-2D]
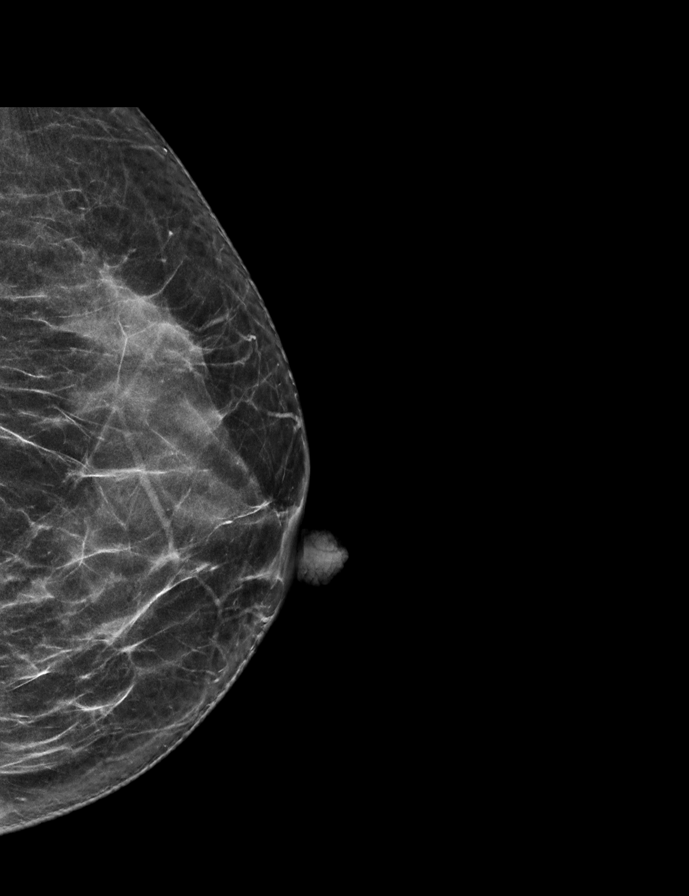

[R MLO synth-2D]
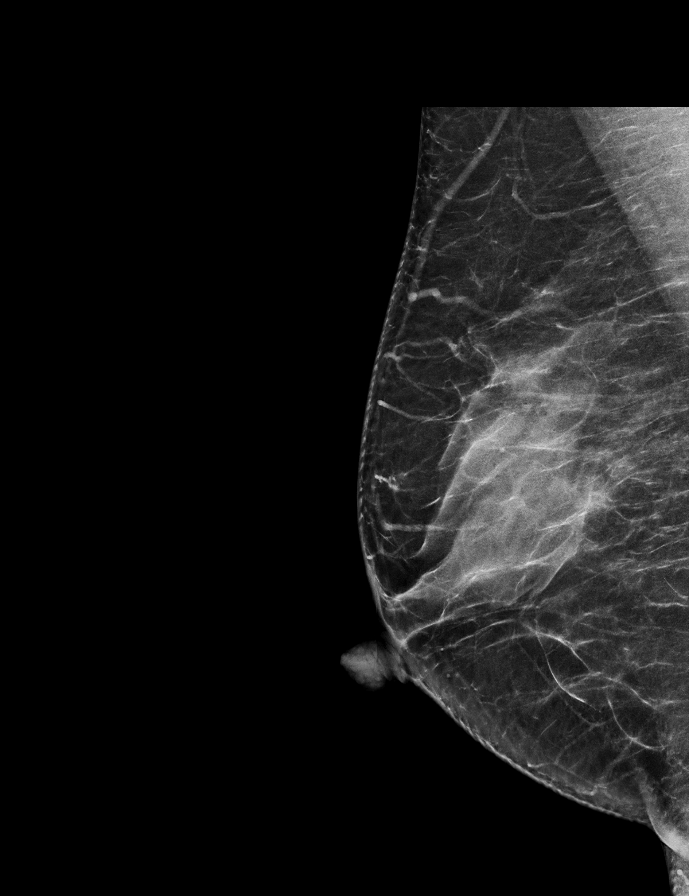

[L MLO synth-2D]
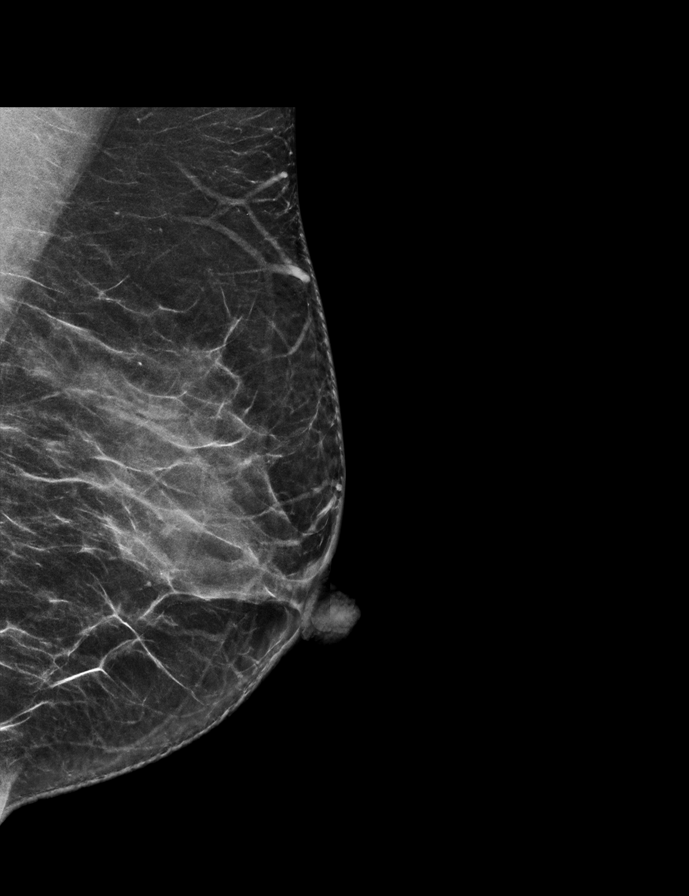

[L CC tomo · 2 of 61 frames shown]
[frame 20/61]
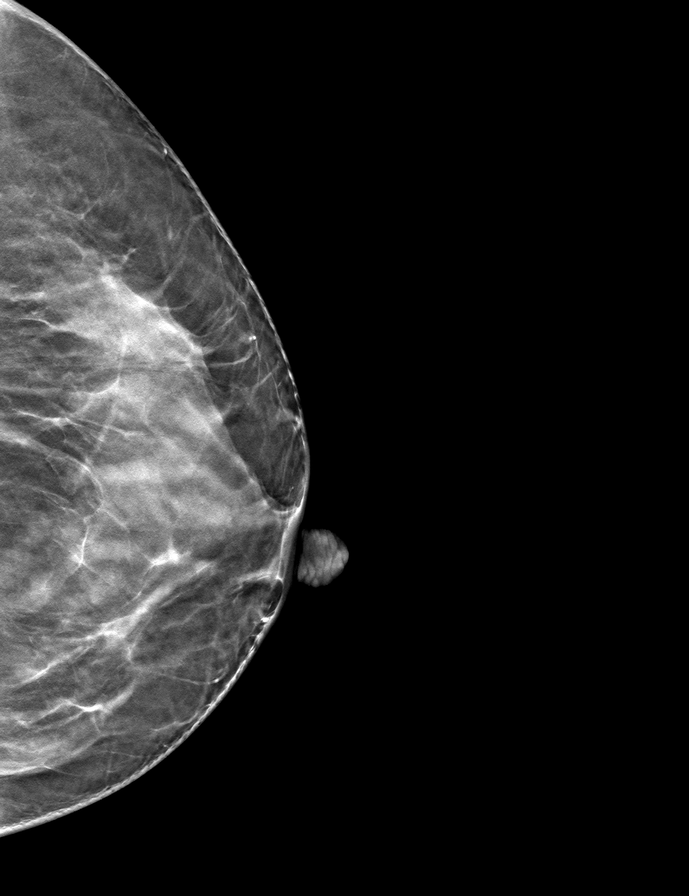
[frame 31/61]
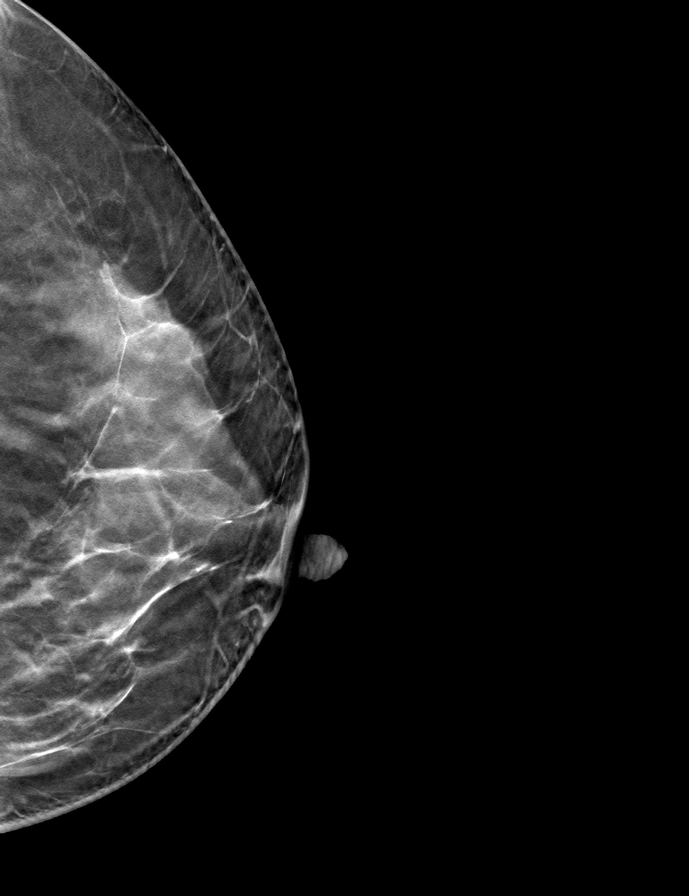

[R MLO tomo · tomo slice 35/69.0]
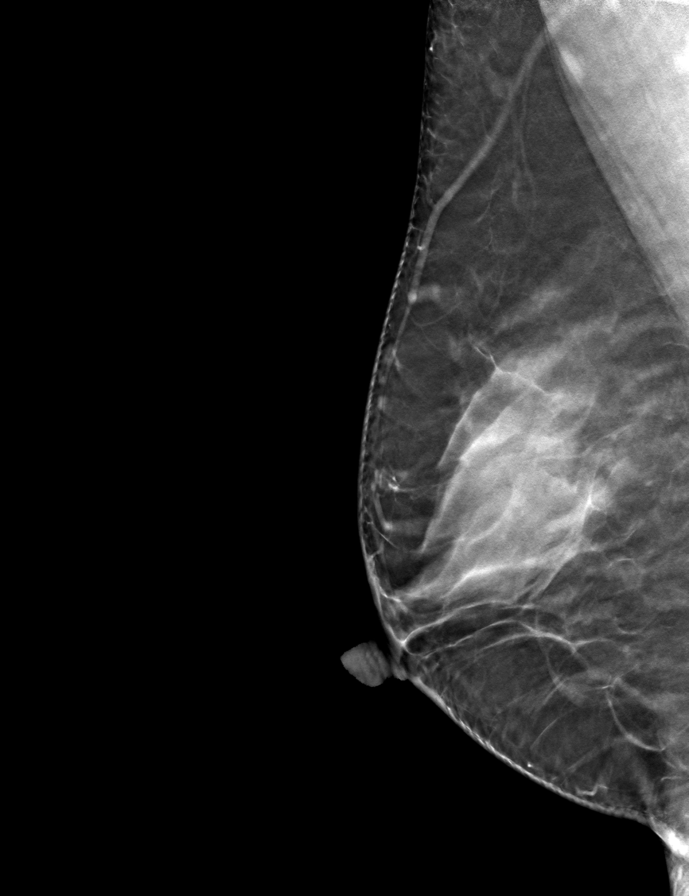

[R CC tomo · tomo slice 32/63.0]
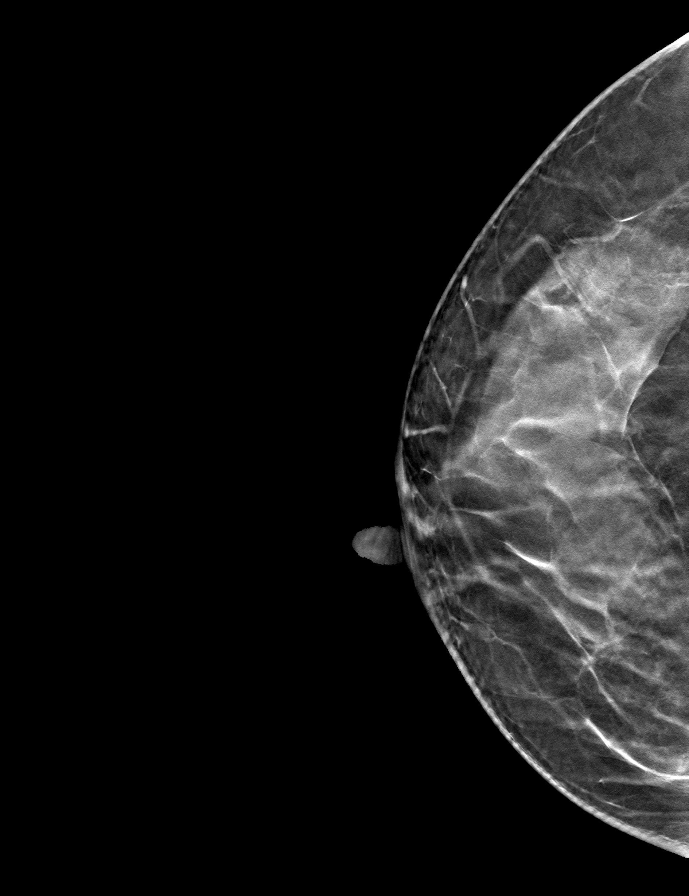

[L MLO tomo · tomo slice 32/63.0]
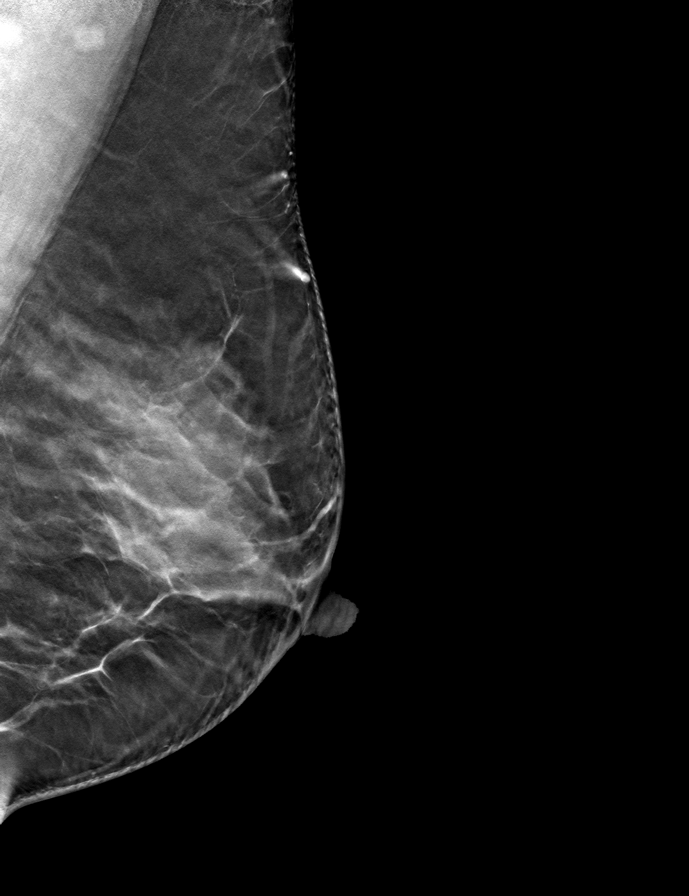

[9 of 24 positions shown; findings below may reference images not displayed]

ACR Breast Density Category d: The breast tissue is extremely dense,
which lowers the sensitivity of mammography
FINDINGS: There are no findings suspicious for malignancy. Images were
processed with CAD.
IMPRESSION: No mammographic evidence of malignancy. A result letter of this
screening mammogram will be mailed directly to the patient.

RECOMMENDATION:
Screening mammogram in one year. (Code:WO-[id number])

BI-RADS CATEGORY  1: Negative.

## 2020-06-16 ENCOUNTER — Other Ambulatory Visit: Payer: Self-pay | Admitting: Obstetrics and Gynecology

## 2020-06-16 DIAGNOSIS — Z1231 Encounter for screening mammogram for malignant neoplasm of breast: Secondary | ICD-10-CM

## 2020-07-27 ENCOUNTER — Encounter (INDEPENDENT_AMBULATORY_CARE_PROVIDER_SITE_OTHER): Payer: Self-pay

## 2020-07-27 ENCOUNTER — Other Ambulatory Visit: Payer: Self-pay

## 2020-07-27 ENCOUNTER — Ambulatory Visit
Admission: RE | Admit: 2020-07-27 | Discharge: 2020-07-27 | Disposition: A | Discharge status: Home / Self Care | Payer: No Typology Code available for payment source | Source: Ambulatory Visit | Attending: Obstetrics and Gynecology | Admitting: Obstetrics and Gynecology

## 2020-07-27 ENCOUNTER — Ambulatory Visit: Payer: Self-pay | Admitting: *Deleted

## 2020-07-27 VITALS — BP 126/88 | Wt 201.4 lb

## 2020-07-27 DIAGNOSIS — Z1239 Encounter for other screening for malignant neoplasm of breast: Secondary | ICD-10-CM

## 2020-07-27 DIAGNOSIS — Z1231 Encounter for screening mammogram for malignant neoplasm of breast: Secondary | ICD-10-CM

## 2020-07-27 NOTE — Progress Notes (Signed)
Ms. Gwendy Antonio-Vite is a 45 y.o. female who presents to [facility] clinic today with no complaints.    Pap Smear: Pap smear not completed today. Last Pap smear was 05/12/2020 at the [facility] clinic and was normal per patient. Per patient has no history of an abnormal Pap smear. Last Pap smear result is not available in Epic.   Physical exam: Breasts Breasts symmetrical. No skin abnormalities bilateral breasts. No nipple retraction bilateral breasts. No nipple discharge bilateral breasts. No lymphadenopathy. No lumps palpated bilateral breasts. No complaints of pain or tenderness on exam.     MS DIGITAL SCREENING BILATERAL  Result Date: 09/04/2015 CLINICAL DATA:  Screening. EXAM: DIGITAL SCREENING BILATERAL MAMMOGRAM WITH CAD COMPARISON:  Previous exam(s). ACR Breast Density Category c: The breast tissue is heterogeneously dense, which may obscure small masses. FINDINGS: There are no findings suspicious for malignancy. Images were processed with CAD. IMPRESSION: No mammographic evidence of malignancy. A result letter of this screening mammogram will be mailed directly to the patient. RECOMMENDATION: Screening mammogram in one year. (Code:SM-B-01Y) BI-RADS CATEGORY  1: Negative. Electronically Signed   By: Amie Portland M.D.   On: 09/04/2015 15:09   MS DIGITAL SCREENING TOMO BILATERAL  Result Date: 12/29/2018 CLINICAL DATA:  Screening. EXAM: DIGITAL SCREENING BILATERAL MAMMOGRAM WITH TOMO AND CAD COMPARISON:  Previous exam(s). ACR Breast Density Category c: The breast tissue is heterogeneously dense, which may obscure small masses. FINDINGS: There are no findings suspicious for malignancy. Images were processed with CAD. IMPRESSION: No mammographic evidence of malignancy. A result letter of this screening mammogram will be mailed directly to the patient. RECOMMENDATION: Screening mammogram in one year. (Code:SM-B-01Y) BI-RADS CATEGORY  1: Negative. Electronically Signed   By:  Edwin Cap M.D.   On: 12/29/2018 17:02   MS DIGITAL SCREENING TOMO BILATERAL  Result Date: 12/10/2017 CLINICAL DATA:  Screening. EXAM: DIGITAL SCREENING BILATERAL MAMMOGRAM WITH TOMO AND CAD COMPARISON:  Previous exam(s). ACR Breast Density Category d: The breast tissue is extremely dense, which lowers the sensitivity of mammography FINDINGS: There are no findings suspicious for malignancy. Images were processed with CAD. IMPRESSION: No mammographic evidence of malignancy. A result letter of this screening mammogram will be mailed directly to the patient. RECOMMENDATION: Screening mammogram in one year. (Code:SM-B-01Y) BI-RADS CATEGORY  1: Negative. Electronically Signed   By: Frederico Hamman M.D.   On: 12/10/2017 10:19     Pelvic/Bimanual Pap is not indicated today per BCCCP guidelines.   Smoking History: Patient has never smoked.   Patient Navigation: Patient education provided. Access to services provided for patient through [facility] program. Spanish interpreter Natale Lay from [facility] provided.  Colorectal Cancer Screening: Per patient has never had colonoscopy completed. No complaints today.    Breast and Cervical Cancer Risk Assessment: Patient does not have family history of breast cancer, known genetic mutations, or radiation treatment to the chest before age 67. Patient does not have history of cervical dysplasia, immunocompromised, or DES exposure in-utero.  Risk Assessment     Risk Scores       07/27/2020 12/24/2018   Last edited by: Priscille Heidelberg, RN Donovan Gatchel, Carlye Grippe, RN   5-year risk: 0.7 % 0.6 %   Lifetime risk: 7.4 % 7.6 %              A: BCCCP exam without pap smear No complaints.  P: Referred patient to the [facility] for a screening mammogram on mobile unit. Appointment scheduled Thursday, July 27, 2020 at 1500.  Priscille Heidelberg, RN 07/27/2020 3:47 PM

## 2020-07-27 NOTE — Patient Instructions (Signed)
Explained breast self awareness with Anna Higgins. Patient did not need a Pap smear today due to last Pap smear was 05/12/2020 per patient. Let her know BCCCP will cover Pap smears every 3 years unless has a history of abnormal Pap smears. Referred patient to the [facility] of [facility] for a screening mammogram on mobile unit. Appointment scheduled Thursday, July 27, 2020 at 1500. Patient escorted to the mobile unit following BCCCP appointment for her screening mammogram. Let patient know the Breast Center will follow up with her within the next couple weeks with results of her mammogram by letter or phone. Anna Higgins verbalized understanding.  Starnisha Batrez, Kathaleen Maser, RN 3:47 PM

## 2022-04-18 ENCOUNTER — Other Ambulatory Visit: Payer: Self-pay | Admitting: Nurse Practitioner

## 2022-04-18 ENCOUNTER — Ambulatory Visit
Admission: RE | Admit: 2022-04-18 | Discharge: 2022-04-18 | Disposition: A | Discharge status: Home / Self Care | Payer: No Typology Code available for payment source | Source: Ambulatory Visit | Attending: Nurse Practitioner | Admitting: Nurse Practitioner

## 2022-04-18 DIAGNOSIS — M25471 Effusion, right ankle: Secondary | ICD-10-CM

## 2022-09-26 ENCOUNTER — Other Ambulatory Visit: Payer: Self-pay | Admitting: Obstetrics & Gynecology

## 2022-09-26 ENCOUNTER — Telehealth: Payer: Self-pay

## 2022-09-26 DIAGNOSIS — Z1231 Encounter for screening mammogram for malignant neoplasm of breast: Secondary | ICD-10-CM

## 2022-09-26 NOTE — Telephone Encounter (Signed)
Telephoned patient using interpreter#[id number]. Left a voice message with BCCCP contact information.

## 2022-10-31 ENCOUNTER — Ambulatory Visit
Admission: RE | Admit: 2022-10-31 | Discharge: 2022-10-31 | Disposition: A | Discharge status: Home / Self Care | Payer: No Typology Code available for payment source | Source: Ambulatory Visit | Attending: Obstetrics & Gynecology | Admitting: Obstetrics & Gynecology

## 2022-10-31 DIAGNOSIS — Z1231 Encounter for screening mammogram for malignant neoplasm of breast: Secondary | ICD-10-CM
